# Patient Record
Sex: Female | Born: 1950
Health system: Southern US, Community
[De-identification: ages and names within clinical notes are randomized; demographics above are authoritative.]

## PROBLEM LIST (undated history)

## (undated) DIAGNOSIS — G8929 Other chronic pain: Secondary | ICD-10-CM

## (undated) DIAGNOSIS — M810 Age-related osteoporosis without current pathological fracture: Secondary | ICD-10-CM

## (undated) DIAGNOSIS — D369 Benign neoplasm, unspecified site: Secondary | ICD-10-CM

## (undated) DIAGNOSIS — R42 Dizziness and giddiness: Secondary | ICD-10-CM

## (undated) DIAGNOSIS — K219 Gastro-esophageal reflux disease without esophagitis: Secondary | ICD-10-CM

## (undated) DIAGNOSIS — I10 Essential (primary) hypertension: Secondary | ICD-10-CM

## (undated) DIAGNOSIS — M549 Dorsalgia, unspecified: Secondary | ICD-10-CM

## (undated) HISTORY — DX: Other chronic pain: G89.29

## (undated) HISTORY — DX: Gastro-esophageal reflux disease without esophagitis: K21.9

## (undated) HISTORY — DX: Dizziness and giddiness: R42

## (undated) HISTORY — PX: ABCESS DRAINAGE: SHX399

## (undated) HISTORY — DX: Benign neoplasm, unspecified site: D36.9

## (undated) HISTORY — DX: Age-related osteoporosis without current pathological fracture: M81.0

## (undated) HISTORY — PX: CHOLECYSTECTOMY: SHX55

## (undated) HISTORY — DX: Dorsalgia, unspecified: M54.9

## (undated) HISTORY — PX: TUBAL LIGATION: SHX77

---

## 2001-07-10 ENCOUNTER — Encounter: Payer: Self-pay | Admitting: Unknown Physician Specialty

## 2001-07-10 ENCOUNTER — Ambulatory Visit (HOSPITAL_COMMUNITY): Admission: RE | Admit: 2001-07-10 | Discharge: 2001-07-10 | Payer: Self-pay | Admitting: Unknown Physician Specialty

## 2001-08-09 ENCOUNTER — Encounter (HOSPITAL_COMMUNITY): Admission: RE | Admit: 2001-08-09 | Discharge: 2001-09-08 | Payer: Self-pay | Admitting: Unknown Physician Specialty

## 2002-05-16 ENCOUNTER — Emergency Department (HOSPITAL_COMMUNITY): Admission: EM | Admit: 2002-05-16 | Discharge: 2002-05-16 | Payer: Self-pay | Admitting: Emergency Medicine

## 2002-11-02 ENCOUNTER — Emergency Department (HOSPITAL_COMMUNITY): Admission: EM | Admit: 2002-11-02 | Discharge: 2002-11-02 | Payer: Self-pay | Admitting: Emergency Medicine

## 2002-11-06 ENCOUNTER — Encounter: Payer: Self-pay | Admitting: Family Medicine

## 2002-11-06 ENCOUNTER — Ambulatory Visit (HOSPITAL_COMMUNITY): Admission: RE | Admit: 2002-11-06 | Discharge: 2002-11-06 | Payer: Self-pay | Admitting: Family Medicine

## 2003-08-18 ENCOUNTER — Emergency Department (HOSPITAL_COMMUNITY): Admission: EM | Admit: 2003-08-18 | Discharge: 2003-08-18 | Payer: Self-pay | Admitting: Emergency Medicine

## 2004-02-14 ENCOUNTER — Emergency Department (HOSPITAL_COMMUNITY): Admission: EM | Admit: 2004-02-14 | Discharge: 2004-02-14 | Payer: Self-pay | Admitting: Emergency Medicine

## 2004-03-28 ENCOUNTER — Emergency Department (HOSPITAL_COMMUNITY): Admission: EM | Admit: 2004-03-28 | Discharge: 2004-03-28 | Payer: Self-pay | Admitting: Emergency Medicine

## 2004-04-08 ENCOUNTER — Emergency Department (HOSPITAL_COMMUNITY): Admission: EM | Admit: 2004-04-08 | Discharge: 2004-04-08 | Payer: Self-pay | Admitting: Emergency Medicine

## 2004-07-13 ENCOUNTER — Ambulatory Visit (HOSPITAL_COMMUNITY): Admission: RE | Admit: 2004-07-13 | Discharge: 2004-07-13 | Payer: Self-pay | Admitting: Family Medicine

## 2004-08-12 ENCOUNTER — Emergency Department (HOSPITAL_COMMUNITY): Admission: EM | Admit: 2004-08-12 | Discharge: 2004-08-12 | Payer: Self-pay | Admitting: Emergency Medicine

## 2004-10-23 ENCOUNTER — Emergency Department (HOSPITAL_COMMUNITY): Admission: EM | Admit: 2004-10-23 | Discharge: 2004-10-23 | Payer: Self-pay | Admitting: Emergency Medicine

## 2005-03-01 ENCOUNTER — Emergency Department (HOSPITAL_COMMUNITY): Admission: EM | Admit: 2005-03-01 | Discharge: 2005-03-01 | Payer: Self-pay | Admitting: Emergency Medicine

## 2005-04-09 ENCOUNTER — Emergency Department (HOSPITAL_COMMUNITY): Admission: EM | Admit: 2005-04-09 | Discharge: 2005-04-09 | Payer: Self-pay | Admitting: *Deleted

## 2005-04-10 ENCOUNTER — Emergency Department (HOSPITAL_COMMUNITY): Admission: EM | Admit: 2005-04-10 | Discharge: 2005-04-10 | Payer: Self-pay | Admitting: Emergency Medicine

## 2005-04-22 ENCOUNTER — Emergency Department (HOSPITAL_COMMUNITY): Admission: EM | Admit: 2005-04-22 | Discharge: 2005-04-22 | Payer: Self-pay | Admitting: Emergency Medicine

## 2005-05-28 ENCOUNTER — Emergency Department (HOSPITAL_COMMUNITY): Admission: EM | Admit: 2005-05-28 | Discharge: 2005-05-28 | Payer: Self-pay | Admitting: Emergency Medicine

## 2005-07-20 ENCOUNTER — Ambulatory Visit (HOSPITAL_COMMUNITY): Admission: RE | Admit: 2005-07-20 | Discharge: 2005-07-20 | Payer: Self-pay | Admitting: Family Medicine

## 2005-07-23 ENCOUNTER — Emergency Department (HOSPITAL_COMMUNITY): Admission: EM | Admit: 2005-07-23 | Discharge: 2005-07-23 | Payer: Self-pay | Admitting: Emergency Medicine

## 2006-06-12 ENCOUNTER — Emergency Department (HOSPITAL_COMMUNITY): Admission: EM | Admit: 2006-06-12 | Discharge: 2006-06-12 | Payer: Self-pay | Admitting: Emergency Medicine

## 2006-11-10 ENCOUNTER — Ambulatory Visit (HOSPITAL_COMMUNITY): Admission: RE | Admit: 2006-11-10 | Discharge: 2006-11-10 | Payer: Self-pay | Admitting: Family Medicine

## 2006-11-11 ENCOUNTER — Emergency Department (HOSPITAL_COMMUNITY): Admission: EM | Admit: 2006-11-11 | Discharge: 2006-11-11 | Payer: Self-pay | Admitting: Emergency Medicine

## 2006-12-24 ENCOUNTER — Emergency Department (HOSPITAL_COMMUNITY): Admission: EM | Admit: 2006-12-24 | Discharge: 2006-12-24 | Payer: Self-pay | Admitting: Emergency Medicine

## 2007-01-21 ENCOUNTER — Emergency Department (HOSPITAL_COMMUNITY): Admission: EM | Admit: 2007-01-21 | Discharge: 2007-01-21 | Payer: Self-pay | Admitting: Emergency Medicine

## 2007-02-10 ENCOUNTER — Emergency Department (HOSPITAL_COMMUNITY): Admission: EM | Admit: 2007-02-10 | Discharge: 2007-02-10 | Payer: Self-pay | Admitting: Emergency Medicine

## 2007-02-12 ENCOUNTER — Emergency Department (HOSPITAL_COMMUNITY): Admission: EM | Admit: 2007-02-12 | Discharge: 2007-02-12 | Payer: Self-pay | Admitting: Emergency Medicine

## 2007-02-16 ENCOUNTER — Ambulatory Visit (HOSPITAL_COMMUNITY): Admission: RE | Admit: 2007-02-16 | Discharge: 2007-02-16 | Payer: Self-pay | Admitting: General Surgery

## 2007-03-12 ENCOUNTER — Emergency Department (HOSPITAL_COMMUNITY): Admission: EM | Admit: 2007-03-12 | Discharge: 2007-03-12 | Payer: Self-pay | Admitting: Emergency Medicine

## 2007-07-15 ENCOUNTER — Emergency Department (HOSPITAL_COMMUNITY): Admission: EM | Admit: 2007-07-15 | Discharge: 2007-07-15 | Payer: Self-pay | Admitting: Emergency Medicine

## 2007-09-15 ENCOUNTER — Emergency Department (HOSPITAL_COMMUNITY): Admission: EM | Admit: 2007-09-15 | Discharge: 2007-09-15 | Payer: Self-pay | Admitting: Emergency Medicine

## 2007-10-08 ENCOUNTER — Emergency Department (HOSPITAL_COMMUNITY): Admission: EM | Admit: 2007-10-08 | Discharge: 2007-10-08 | Payer: Self-pay | Admitting: Emergency Medicine

## 2007-10-27 ENCOUNTER — Emergency Department (HOSPITAL_COMMUNITY): Admission: EM | Admit: 2007-10-27 | Discharge: 2007-10-27 | Payer: Self-pay | Admitting: Emergency Medicine

## 2007-11-04 ENCOUNTER — Emergency Department (HOSPITAL_COMMUNITY): Admission: EM | Admit: 2007-11-04 | Discharge: 2007-11-04 | Payer: Self-pay | Admitting: Emergency Medicine

## 2007-11-22 ENCOUNTER — Ambulatory Visit (HOSPITAL_COMMUNITY): Admission: RE | Admit: 2007-11-22 | Discharge: 2007-11-22 | Payer: Self-pay | Admitting: Family Medicine

## 2007-12-02 ENCOUNTER — Emergency Department (HOSPITAL_COMMUNITY): Admission: EM | Admit: 2007-12-02 | Discharge: 2007-12-02 | Payer: Self-pay | Admitting: Emergency Medicine

## 2008-12-13 ENCOUNTER — Emergency Department (HOSPITAL_COMMUNITY): Admission: EM | Admit: 2008-12-13 | Discharge: 2008-12-13 | Payer: Self-pay | Admitting: Emergency Medicine

## 2009-02-01 ENCOUNTER — Emergency Department (HOSPITAL_COMMUNITY): Admission: EM | Admit: 2009-02-01 | Discharge: 2009-02-01 | Payer: Self-pay | Admitting: Emergency Medicine

## 2009-02-20 ENCOUNTER — Emergency Department (HOSPITAL_COMMUNITY): Admission: EM | Admit: 2009-02-20 | Discharge: 2009-02-20 | Payer: Self-pay | Admitting: Emergency Medicine

## 2009-03-11 ENCOUNTER — Ambulatory Visit (HOSPITAL_COMMUNITY): Admission: RE | Admit: 2009-03-11 | Discharge: 2009-03-11 | Payer: Self-pay | Admitting: Family Medicine

## 2009-05-25 ENCOUNTER — Emergency Department (HOSPITAL_COMMUNITY): Admission: EM | Admit: 2009-05-25 | Discharge: 2009-05-26 | Payer: Self-pay | Admitting: Emergency Medicine

## 2009-06-08 ENCOUNTER — Emergency Department (HOSPITAL_COMMUNITY): Admission: EM | Admit: 2009-06-08 | Discharge: 2009-06-08 | Payer: Self-pay | Admitting: Emergency Medicine

## 2009-09-21 ENCOUNTER — Emergency Department (HOSPITAL_COMMUNITY): Admission: EM | Admit: 2009-09-21 | Discharge: 2009-09-21 | Payer: Self-pay | Admitting: Emergency Medicine

## 2010-05-31 ENCOUNTER — Emergency Department (HOSPITAL_COMMUNITY): Admission: EM | Admit: 2010-05-31 | Discharge: 2010-05-31 | Payer: Self-pay | Admitting: Emergency Medicine

## 2010-06-27 ENCOUNTER — Emergency Department (HOSPITAL_COMMUNITY): Admission: EM | Admit: 2010-06-27 | Discharge: 2010-06-27 | Payer: Self-pay | Admitting: Emergency Medicine

## 2010-07-14 ENCOUNTER — Emergency Department (HOSPITAL_COMMUNITY)
Admission: EM | Admit: 2010-07-14 | Discharge: 2010-07-14 | Payer: Self-pay | Source: Home / Self Care | Admitting: Emergency Medicine

## 2010-07-27 ENCOUNTER — Emergency Department (HOSPITAL_COMMUNITY)
Admission: EM | Admit: 2010-07-27 | Discharge: 2010-07-27 | Payer: Self-pay | Source: Home / Self Care | Admitting: Emergency Medicine

## 2010-08-22 ENCOUNTER — Encounter: Payer: Self-pay | Admitting: Family Medicine

## 2010-10-13 LAB — DIFFERENTIAL
Basophils Absolute: 0 10*3/uL (ref 0.0–0.1)
Basophils Relative: 1 % (ref 0–1)
Eosinophils Absolute: 0.1 10*3/uL (ref 0.0–0.7)
Eosinophils Relative: 3 % (ref 0–5)
Monocytes Absolute: 0.5 10*3/uL (ref 0.1–1.0)

## 2010-10-13 LAB — URINE MICROSCOPIC-ADD ON

## 2010-10-13 LAB — COMPREHENSIVE METABOLIC PANEL
AST: 18 U/L (ref 0–37)
Albumin: 4.5 g/dL (ref 3.5–5.2)
Alkaline Phosphatase: 59 U/L (ref 39–117)
CO2: 24 mEq/L (ref 19–32)
Chloride: 104 mEq/L (ref 96–112)
GFR calc Af Amer: >60 mL/min (ref >60)
GFR calc non Af Amer: 54 mL/min — ABNORMAL LOW (ref >60)
Potassium: 3.5 mEq/L (ref 3.5–5.1)
Total Bilirubin: 0.2 mg/dL — ABNORMAL LOW (ref 0.3–1.2)

## 2010-10-13 LAB — URINE CULTURE
Colony Count: NO GROWTH
Culture: NO GROWTH

## 2010-10-13 LAB — URINALYSIS, ROUTINE W REFLEX MICROSCOPIC
Glucose, UA: NEGATIVE mg/dL
Leukocytes, UA: NEGATIVE
Nitrite: NEGATIVE
pH: 5 (ref 5.0–8.0)

## 2010-10-13 LAB — CBC
Hemoglobin: 11.2 g/dL — ABNORMAL LOW (ref 12.0–15.0)
MCV: 93.7 fL (ref 78.0–100.0)
Platelets: 272 10*3/uL (ref 150–400)
RBC: 3.69 MIL/uL — ABNORMAL LOW (ref 3.87–5.11)
WBC: 4.8 10*3/uL (ref 4.0–10.5)

## 2010-10-31 ENCOUNTER — Emergency Department (HOSPITAL_COMMUNITY)
Admission: EM | Admit: 2010-10-31 | Discharge: 2010-10-31 | Disposition: A | Discharge status: Home / Self Care | Payer: Self-pay | Attending: Emergency Medicine | Admitting: Emergency Medicine

## 2010-10-31 DIAGNOSIS — X58XXXA Exposure to other specified factors, initial encounter: Secondary | ICD-10-CM | POA: Insufficient documentation

## 2010-10-31 DIAGNOSIS — M545 Low back pain, unspecified: Secondary | ICD-10-CM | POA: Insufficient documentation

## 2010-10-31 DIAGNOSIS — S335XXA Sprain of ligaments of lumbar spine, initial encounter: Secondary | ICD-10-CM | POA: Insufficient documentation

## 2010-10-31 DIAGNOSIS — Z79899 Other long term (current) drug therapy: Secondary | ICD-10-CM | POA: Insufficient documentation

## 2010-10-31 DIAGNOSIS — I10 Essential (primary) hypertension: Secondary | ICD-10-CM | POA: Insufficient documentation

## 2010-10-31 LAB — URINALYSIS, ROUTINE W REFLEX MICROSCOPIC
Bilirubin Urine: NEGATIVE
Glucose, UA: NEGATIVE mg/dL
Ketones, ur: NEGATIVE mg/dL
Leukocytes, UA: NEGATIVE
Nitrite: NEGATIVE
Protein, ur: NEGATIVE mg/dL
Specific Gravity, Urine: 1.02 (ref 1.005–1.030)
Urobilinogen, UA: 0.2 mg/dL (ref 0.0–1.0)
pH: 6 (ref 5.0–8.0)

## 2010-10-31 LAB — URINE MICROSCOPIC-ADD ON

## 2010-11-03 LAB — COMPREHENSIVE METABOLIC PANEL
AST: 22 U/L (ref 0–37)
Albumin: 4.5 g/dL (ref 3.5–5.2)
Calcium: 9.8 mg/dL (ref 8.4–10.5)
Chloride: 97 mEq/L (ref 96–112)
Creatinine, Ser: 0.95 mg/dL (ref 0.4–1.2)
GFR calc Af Amer: >60 mL/min (ref >60)

## 2010-11-03 LAB — DIFFERENTIAL
Eosinophils Relative: 2 % (ref 0–5)
Lymphocytes Relative: 32 % (ref 12–46)
Lymphs Abs: 1.7 10*3/uL (ref 0.7–4.0)
Monocytes Absolute: 0.6 10*3/uL (ref 0.1–1.0)
Monocytes Relative: 10 % (ref 3–12)

## 2010-11-03 LAB — CBC
MCHC: 33.2 g/dL (ref 30.0–36.0)
MCV: 92.9 fL (ref 78.0–100.0)
Platelets: 261 10*3/uL (ref 150–400)
WBC: 5.4 10*3/uL (ref 4.0–10.5)

## 2010-11-03 LAB — URINE MICROSCOPIC-ADD ON

## 2010-11-03 LAB — URINE CULTURE: Culture: NO GROWTH

## 2010-11-03 LAB — URINALYSIS, ROUTINE W REFLEX MICROSCOPIC
Glucose, UA: NEGATIVE mg/dL
Hgb urine dipstick: NEGATIVE
Specific Gravity, Urine: 1.025 (ref 1.005–1.030)
Urobilinogen, UA: 0.2 mg/dL (ref 0.0–1.0)
pH: 6 (ref 5.0–8.0)

## 2010-11-08 ENCOUNTER — Emergency Department (HOSPITAL_COMMUNITY)
Admission: EM | Admit: 2010-11-08 | Discharge: 2010-11-08 | Disposition: A | Discharge status: Home / Self Care | Payer: Self-pay | Attending: Emergency Medicine | Admitting: Emergency Medicine

## 2010-11-08 DIAGNOSIS — R51 Headache: Secondary | ICD-10-CM | POA: Insufficient documentation

## 2010-11-08 DIAGNOSIS — I10 Essential (primary) hypertension: Secondary | ICD-10-CM | POA: Insufficient documentation

## 2010-12-14 NOTE — H&P (Signed)
NAMEGREDMARIE, DELANGE                 ACCOUNT NO.:  [id number]   MEDICAL RECORD NO.:  [id number]          PATIENT TYPE:  AMB   LOCATION:  DAY                           FACILITY:  [facility]   PHYSICIAN:  Dalia Heading, M.D.  DATE OF BIRTH:  07-14-1951   DATE OF ADMISSION:  02/16/2007  DATE OF DISCHARGE:  LH                              HISTORY & PHYSICAL   CHIEF COMPLAINT:  Abscess, groin.   HISTORY OF PRESENT ILLNESS:  The patient is a 60 year old black female  who was referred from the emergency room for evaluation and treatment of  a boil in the left groin region.  It was lanced in the emergency room  last week.  She was started on Bactrim.  The abscess has recurred and  has worsened.   Past medical history includes hypertension.   PAST SURGICAL HISTORY:  Unremarkable.   CURRENT MEDICATIONS:  A blood pressure pill.   ALLERGIES:  No known drug allergies.   REVIEW OF SYSTEMS:  Noncontributory.   On physical examination, the patient is a well-developed, well-nourished  black female in no acute distress.  Lungs are clear to auscultation with equal breath sounds bilaterally.  Heart examination reveals regular rate and rhythm without S3, S4, or  murmurs.  In the left groin/pubic area there is an abscess with induration and  erythema present.   IMPRESSION:  Recurring abscess, left groin.   PLAN:  The patient is scheduled for an incision and drainage of the  abscess in the groin area on February 16, 2007.  The risks and benefits of  the procedure were fully explained to the patient, who gave informed  consent.      Dalia Heading, M.D.  Electronically Signed    MAJ/MEDQ  D:  02/15/2007  T:  02/16/2007  Job:  [id number]   cc:   [facility]  Fax: [phone]

## 2010-12-14 NOTE — Op Note (Signed)
NAMEKARISHA, MARLIN                 ACCOUNT NO.:  [id number]   MEDICAL RECORD NO.:  [id number]          PATIENT TYPE:  AMB   LOCATION:  DAY                           FACILITY:  [facility]   PHYSICIAN:  Dalia Heading, M.D.  DATE OF BIRTH:  [birth year]   DATE OF PROCEDURE:  02/16/2007  DATE OF DISCHARGE:                               OPERATIVE REPORT   PREOPERATIVE DIAGNOSIS:  Groin abscess.   POSTOPERATIVE DIAGNOSIS:  Groin abscess.   PROCEDURE:  Incision and drainage of left groin abscess.   SURGEON:  Franky Macho, MD   ANESTHESIA:  General.   INDICATIONS:  The patient is a 60 year old black female status post  incision and drainage of a left groin abscess in the emergency room, who  presents with non-resolution of the abscess.  The risks and benefits of  the procedure were fully explained to the patient, who gave informed  consent.   PROCEDURE NOTE:  The patient was placed in the supine position.  After  general anesthesia was administered, the left groin region was prepped  and draped using the usual sterile technique with Betadine.  Surgical  site confirmation was performed.   An indurated area along the left suprapubic groin area was noted.  The  induration was approximately 3-4 cm in an ovoid manner.  Incision was  performed.  A small amount of purulent fluid that was obtained.  Aerobic  cultures were taken and sent to microbiology for further examination.  Any necrotic tissue was debrided.  Any bleeding was controlled using  Bovie electrocautery.  The wound was packed with 1/2-inch Iodoform Nu  Gauze.  A dry sterile dressing was then applied.   All tape and needle counts were correct at the end of the procedure.  The patient was awakened and transferred to PACU in stable condition.   COMPLICATIONS:  None.   SPECIMEN:  None.   BLOOD LOSS:  Minimal.      Dalia Heading, M.D.  Electronically Signed     MAJ/MEDQ  D:  02/16/2007  T:  02/17/2007  Job:  [id number]

## 2011-04-25 LAB — URINALYSIS, ROUTINE W REFLEX MICROSCOPIC
Ketones, ur: NEGATIVE
Nitrite: NEGATIVE
Protein, ur: NEGATIVE
pH: 5

## 2011-05-09 LAB — URINE CULTURE: Colony Count: 8000

## 2011-05-09 LAB — URINALYSIS, ROUTINE W REFLEX MICROSCOPIC
Nitrite: NEGATIVE
Specific Gravity, Urine: 1.025
Urobilinogen, UA: 0.2

## 2011-05-16 LAB — BASIC METABOLIC PANEL
BUN: 17
CO2: 24
Calcium: 9.4
Chloride: 108
Creatinine, Ser: 1.12
GFR calc Af Amer: >60
GFR calc non Af Amer: 51 — ABNORMAL LOW
Glucose, Bld: 84
Potassium: 4.6
Sodium: 138

## 2011-05-16 LAB — CULTURE, ROUTINE-ABSCESS

## 2011-05-16 LAB — CBC
MCHC: 32.9
RBC: 3.37 — ABNORMAL LOW
WBC: 5.2

## 2011-05-17 LAB — CULTURE, ROUTINE-ABSCESS

## 2011-12-26 ENCOUNTER — Emergency Department (HOSPITAL_COMMUNITY)
Admission: EM | Admit: 2011-12-26 | Discharge: 2011-12-26 | Disposition: A | Discharge status: Home / Self Care | Payer: Self-pay | Attending: Emergency Medicine | Admitting: Emergency Medicine

## 2011-12-26 ENCOUNTER — Encounter (HOSPITAL_COMMUNITY): Payer: Self-pay | Admitting: *Deleted

## 2011-12-26 DIAGNOSIS — I10 Essential (primary) hypertension: Secondary | ICD-10-CM | POA: Insufficient documentation

## 2011-12-26 DIAGNOSIS — M5430 Sciatica, unspecified side: Secondary | ICD-10-CM | POA: Insufficient documentation

## 2011-12-26 HISTORY — DX: Essential (primary) hypertension: I10

## 2011-12-26 LAB — URINALYSIS, ROUTINE W REFLEX MICROSCOPIC
Ketones, ur: NEGATIVE mg/dL
Leukocytes, UA: NEGATIVE
Nitrite: NEGATIVE
Urobilinogen, UA: 0.2 mg/dL (ref 0.0–1.0)
pH: 6 (ref 5.0–8.0)

## 2011-12-26 LAB — URINE MICROSCOPIC-ADD ON

## 2011-12-26 MED ORDER — PREDNISONE 20 MG PO TABS
60.0000 mg | ORAL_TABLET | Freq: Once | ORAL | Status: AC
  Administered 2011-12-26: 60 mg via ORAL
  Filled 2011-12-26: qty 3

## 2011-12-26 MED ORDER — PREDNISONE 10 MG PO TABS: 20.0000 mg | ORAL_TABLET | Freq: Every day | ORAL | Status: AC

## 2011-12-26 MED ORDER — HYDROCODONE-ACETAMINOPHEN 5-325 MG PO TABS
1.0000 | ORAL_TABLET | Freq: Once | ORAL | Status: AC
  Administered 2011-12-26: 1 via ORAL
  Filled 2011-12-26: qty 1

## 2011-12-26 MED ORDER — HYDROCODONE-ACETAMINOPHEN 5-325 MG PO TABS: 1.0000 | ORAL_TABLET | ORAL | Status: AC | PRN

## 2011-12-26 NOTE — ED Notes (Signed)
Discharge instructions reviewed with pt, questions answered. Pt verbalized understanding.  

## 2011-12-26 NOTE — Discharge Instructions (Signed)
You have a sciatica. Make position changes slowly. He may apply heat to the lower back for comfort. The most comfortable sitting position is going to be in a recliner. Take all of the prednisone. Use the pain medicine as needed.

## 2011-12-26 NOTE — ED Notes (Signed)
Pt reporting pain in left flank area, radiating to front.  Denies problems with urination, such as frequency or burning.

## 2011-12-26 NOTE — ED Provider Notes (Signed)
History     CSN: [id number]  Arrival date & time 12/26/11  0114   First MD Initiated Contact with Patient 12/26/11 [phone]      Chief Complaint  Patient presents with  . Flank Pain    (Consider location/radiation/quality/duration/timing/severity/associated sxs/prior treatment) HPI Megan Barber is a 61 y.o. female who presents to the Emergency Department complaining of right sided buttock pain with radiation down her leg that began on Wednesday. She is not aware of any injury or unusual movement. Pain has been getting progressively worse. Described as a sharp stabbing pain that begins in the buttock and travels down the back of her leg. Worse with sitting and walking . Hard to find a comfortable position.Denies fever, chills, nausea, vomiting. Denies numbness or tingling. Denies extremity weakness.   Past Medical History  Diagnosis Date  . Hypertension     Past Surgical History  Procedure Date  . Abdominal hysterectomy     History reviewed. No pertinent family history.  History  Substance Use Topics  . Smoking status: Never Smoker   . Smokeless tobacco: Not on file  . Alcohol Use: No    OB History    Grav Para Term Preterm Abortions TAB SAB Ect Mult Living                  Review of Systems  Constitutional: Negative for fever.       10 Systems reviewed and are negative for acute change except as noted in the HPI.  HENT: Negative for congestion.   Eyes: Negative for discharge and redness.  Respiratory: Negative for cough and shortness of breath.   Cardiovascular: Negative for chest pain.  Gastrointestinal: Negative for vomiting and abdominal pain.  Musculoskeletal: Negative for back pain.       Right buttock pain, right leg pain  Skin: Negative for rash.  Neurological: Negative for syncope, numbness and headaches.  Psychiatric/Behavioral:       No behavior change.    Allergies  Darvocet and Toradol  Home Medications   Current Outpatient Rx  Name Route Sig  Dispense Refill  . LISINOPRIL 10 MG PO TABS Oral Take 10 mg by mouth daily.      BP 128/70  Pulse 72  Temp 97.5 F (36.4 C)  Resp 20  Ht 5\' 5"  (1.651 m)  Wt 190 lb (86.183 kg)  BMI 31.62 kg/m2  SpO2 99%  Physical Exam  Nursing note and vitals reviewed. Constitutional:       Awake, alert, nontoxic appearance with baseline speech.  HENT:  Head: Atraumatic.  Eyes: Pupils are equal, round, and reactive to light. Right eye exhibits no discharge. Left eye exhibits no discharge.  Neck: Neck supple.  Cardiovascular: Normal rate and regular rhythm.   No murmur heard. Pulmonary/Chest: Effort normal and breath sounds normal. No respiratory distress. She has no wheezes. She has no rales. She exhibits no tenderness.  Abdominal: Soft. Bowel sounds are normal. She exhibits no mass. There is no tenderness. There is no rebound.  Musculoskeletal:       Thoracic back: She exhibits no tenderness.       Lumbar back: She exhibits no tenderness.       Bilateral lower extremities non tender without new rashes or color change, baseline ROM with intact DP / PT pulses, CR<2 secs all digits bilaterally, sensation baseline light touch bilaterally for pt, DTR's symmetric and intact bilaterally KJ / AJ, motor symmetric bilateral 5 / 5 hip flexion, quadriceps, hamstrings, EHL,  foot dorsiflexion, foot plantarflexion, gait somewhat antalgic but without apparent new ataxia.Not able to bend over to reach for her toes due to pain that shoots from buttock to heel. Positive straight leg raise on the right.  Neurological:       Mental status baseline for patient.  Upper extremity motor strength and sensation intact and symmetric bilaterally.  Skin: No rash noted.  Psychiatric: She has a normal mood and affect.    ED Course  Procedures (including critical care time)  Results for orders placed during the hospital encounter of 12/26/11  URINALYSIS, ROUTINE W REFLEX MICROSCOPIC      Component Value Range   Color,  Urine YELLOW  YELLOW    APPearance CLEAR  CLEAR    Specific Gravity, Urine 1.025  1.005 - 1.030    pH 6.0  5.0 - 8.0    Glucose, UA NEGATIVE  NEGATIVE (mg/dL)   Hgb urine dipstick TRACE (*) NEGATIVE    Bilirubin Urine NEGATIVE  NEGATIVE    Ketones, ur NEGATIVE  NEGATIVE (mg/dL)   Protein, ur TRACE (*) NEGATIVE (mg/dL)   Urobilinogen, UA 0.2  0.0 - 1.0 (mg/dL)   Nitrite NEGATIVE  NEGATIVE    Leukocytes, UA NEGATIVE  NEGATIVE   URINE MICROSCOPIC-ADD ON      Component Value Range   Squamous Epithelial / LPF MANY (*) RARE    WBC, UA 0-2  <3 (WBC/hpf)   RBC / HPF 0-2  <3 (RBC/hpf)   Bacteria, UA FEW (*) RARE    Urine-Other CLUE CELLS PRESENT        MDM  Patient with pain to the right buttocks that radiates to the right lower leg has been present since Wednesday. Physical exam is consistent with a sciatica. Initiated steroid and analgesic therapy.Pt stable in ED with no significant deterioration in condition.The patient appears reasonably screened and/or stabilized for discharge and I doubt any other medical condition or other [facility] requiring further screening, evaluation, or treatment in the ED at this time prior to discharge.  MDM Reviewed: nursing note and vitals Interpretation: labs           Nicoletta Dress. Colon Branch, MD 12/26/11 1610

## 2012-05-29 ENCOUNTER — Encounter (HOSPITAL_COMMUNITY): Payer: Self-pay

## 2012-05-29 ENCOUNTER — Emergency Department (HOSPITAL_COMMUNITY)
Admission: EM | Admit: 2012-05-29 | Discharge: 2012-05-29 | Disposition: A | Discharge status: Home / Self Care | Payer: Self-pay | Attending: Emergency Medicine | Admitting: Emergency Medicine

## 2012-05-29 DIAGNOSIS — T7840XA Allergy, unspecified, initial encounter: Secondary | ICD-10-CM | POA: Insufficient documentation

## 2012-05-29 DIAGNOSIS — I10 Essential (primary) hypertension: Secondary | ICD-10-CM | POA: Insufficient documentation

## 2012-05-29 DIAGNOSIS — R229 Localized swelling, mass and lump, unspecified: Secondary | ICD-10-CM | POA: Insufficient documentation

## 2012-05-29 DIAGNOSIS — M549 Dorsalgia, unspecified: Secondary | ICD-10-CM | POA: Insufficient documentation

## 2012-05-29 LAB — URINALYSIS, ROUTINE W REFLEX MICROSCOPIC
Glucose, UA: NEGATIVE mg/dL
Protein, ur: NEGATIVE mg/dL
Specific Gravity, Urine: 1.025 (ref 1.005–1.030)
pH: 5.5 (ref 5.0–8.0)

## 2012-05-29 LAB — URINE MICROSCOPIC-ADD ON

## 2012-05-29 MED ORDER — LORATADINE 10 MG PO TABS
10.0000 mg | ORAL_TABLET | Freq: Once | ORAL | Status: AC
  Administered 2012-05-29: 10 mg via ORAL
  Filled 2012-05-29: qty 1

## 2012-05-29 MED ORDER — FAMOTIDINE 20 MG PO TABS
40.0000 mg | ORAL_TABLET | Freq: Every day | ORAL | Status: DC
  Administered 2012-05-29: 40 mg via ORAL
  Filled 2012-05-29: qty 2

## 2012-05-29 MED ORDER — PREDNISONE 50 MG PO TABS
60.0000 mg | ORAL_TABLET | Freq: Once | ORAL | Status: AC
  Administered 2012-05-29: 60 mg via ORAL
  Filled 2012-05-29: qty 1

## 2012-05-29 MED ORDER — PREDNISONE 50 MG PO TABS: 50.0000 mg | ORAL_TABLET | Freq: Every day | ORAL | Status: DC

## 2012-05-29 MED ORDER — IBUPROFEN 800 MG PO TABS
800.0000 mg | ORAL_TABLET | Freq: Once | ORAL | Status: AC
  Administered 2012-05-29: 800 mg via ORAL
  Filled 2012-05-29: qty 1

## 2012-05-29 NOTE — ED Notes (Signed)
Pt reports that for the last week she has been waking every morning with "something else hurting" and ?abcesses to different spots. First area was at her neck. Woke this am with right lower back hurting and right eye swollen.

## 2012-05-29 NOTE — ED Provider Notes (Signed)
History    This chart was scribed for Donnetta Hutching, MD, MD by Smitty Pluck. The patient was seen in room APA10 and the patient's care was started at 12:41PM.   CSN: [id number]  Arrival date & time 05/29/12  4098      Chief Complaint  Patient presents with  . Facial Swelling  . Wound Check    to back of neck  . Rash  . Back Pain    (Consider location/radiation/quality/duration/timing/severity/associated sxs/prior treatment) Patient is a 61 y.o. female presenting with wound check, rash, and back pain. The history is provided by the patient. No language interpreter was used.  Wound Check   Rash   Back Pain    Megan Barber is a 61 y.o. female who presents to the Emergency Department complaining of constant, moderate rash onset 1 week ago. Pt reports that she has bumps that occur over different areas of body onset 1 week ago. She denies change in diet, new soaps and new detergent. She reports that she has right lower lateral with radiation to right flank pain onset this morning. Denies dysuria, difficulty urinating, nausea, fever and any other pain.      Past Medical History  Diagnosis Date  . Hypertension     Past Surgical History  Procedure Date  . Abdominal hysterectomy     No family history on file.  History  Substance Use Topics  . Smoking status: Never Smoker   . Smokeless tobacco: Not on file  . Alcohol Use: No    OB History    Grav Para Term Preterm Abortions TAB SAB Ect Mult Living                  Review of Systems  All other systems reviewed and are negative.  10 Systems reviewed and all are negative for acute change except as noted in the HPI.    Allergies  Darvocet and Toradol  Home Medications   Current Outpatient Rx  Name Route Sig Dispense Refill  . DIPHENHYDRAMINE HCL (SLEEP) 25 MG PO TABS Oral Take 50 mg by mouth 3 (three) times daily as needed. Itching.    Marland Kitchen HYDROCORTISONE 1 % EX CREA Topical Apply 1 application topically 2 (two)  times daily.    Marland Kitchen LISINOPRIL 10 MG PO TABS Oral Take 10 mg by mouth daily.      BP 112/93  Pulse 98  Temp 97.5 F (36.4 C) (Oral)  Resp 20  Ht 5\' 5"  (1.651 m)  Wt 185 lb (83.915 kg)  BMI 30.79 kg/m2  SpO2 100%  Physical Exam  Nursing note and vitals reviewed. Constitutional: She is oriented to person, place, and time. She appears well-developed and well-nourished.  HENT:  Head: Normocephalic and atraumatic.  Eyes: Conjunctivae normal and EOM are normal. Pupils are equal, round, and reactive to light.  Neck: Normal range of motion. Neck supple.  Cardiovascular: Normal rate, regular rhythm and normal heart sounds.   Pulmonary/Chest: Effort normal and breath sounds normal.  Abdominal: Soft. Bowel sounds are normal.  Musculoskeletal: Normal range of motion.  Neurological: She is alert and oriented to person, place, and time.  Skin: Skin is warm and dry.       Papular erythematous areas suggestive of wheal    Psychiatric: She has a normal mood and affect.    ED Course  Procedures (including critical care time) DIAGNOSTIC STUDIES: Oxygen Saturation is 100% on room air, normal by my interpretation.    COORDINATION OF CARE:  12:53 PM Discussed ED treatment with pt (PO Pepcid, PO benadryl, PO prednisone)    Labs Reviewed - No data to display No results found.   No diagnosis found.    MDM  Skin rash appears to be allergic. No evidence of cellulitis. Benign abdomen.  Patient is in no acute distress. Discharge home on prednisone for one week, Claritin, Pepcid      I personally performed the services described in this documentation, which was scribed in my presence. The recorded information has been reviewed and considered.    Donnetta Hutching, MD 05/29/12 1425

## 2012-09-10 ENCOUNTER — Encounter (HOSPITAL_COMMUNITY): Payer: Self-pay | Admitting: *Deleted

## 2012-09-10 ENCOUNTER — Emergency Department (HOSPITAL_COMMUNITY)
Admission: EM | Admit: 2012-09-10 | Discharge: 2012-09-10 | Disposition: A | Discharge status: Home / Self Care | Payer: Self-pay | Attending: Emergency Medicine | Admitting: Emergency Medicine

## 2012-09-10 DIAGNOSIS — M169 Osteoarthritis of hip, unspecified: Secondary | ICD-10-CM | POA: Insufficient documentation

## 2012-09-10 DIAGNOSIS — Z79899 Other long term (current) drug therapy: Secondary | ICD-10-CM | POA: Insufficient documentation

## 2012-09-10 DIAGNOSIS — M1990 Unspecified osteoarthritis, unspecified site: Secondary | ICD-10-CM

## 2012-09-10 DIAGNOSIS — M25559 Pain in unspecified hip: Secondary | ICD-10-CM | POA: Insufficient documentation

## 2012-09-10 DIAGNOSIS — M1610 Unilateral primary osteoarthritis, unspecified hip: Secondary | ICD-10-CM | POA: Insufficient documentation

## 2012-09-10 DIAGNOSIS — M25552 Pain in left hip: Secondary | ICD-10-CM

## 2012-09-10 DIAGNOSIS — I10 Essential (primary) hypertension: Secondary | ICD-10-CM | POA: Insufficient documentation

## 2012-09-10 MED ORDER — DEXAMETHASONE 4 MG PO TABS: 4.0000 mg | ORAL_TABLET | Freq: Every day | ORAL | Status: DC

## 2012-09-10 MED ORDER — DEXAMETHASONE SODIUM PHOSPHATE 4 MG/ML IJ SOLN
8.0000 mg | Freq: Once | INTRAMUSCULAR | Status: AC
  Administered 2012-09-10: 8 mg via INTRAMUSCULAR
  Filled 2012-09-10: qty 2

## 2012-09-10 MED ORDER — HYDROCODONE-ACETAMINOPHEN 5-325 MG PO TABS: ORAL_TABLET | ORAL | Status: DC

## 2012-09-10 MED ORDER — PROMETHAZINE HCL 12.5 MG PO TABS
12.5000 mg | ORAL_TABLET | Freq: Once | ORAL | Status: AC
  Administered 2012-09-10: 12.5 mg via ORAL
  Filled 2012-09-10: qty 1

## 2012-09-10 MED ORDER — HYDROCODONE-ACETAMINOPHEN 5-325 MG PO TABS
2.0000 | ORAL_TABLET | Freq: Once | ORAL | Status: AC
  Administered 2012-09-10: 2 via ORAL
  Filled 2012-09-10: qty 2

## 2012-09-10 NOTE — ED Provider Notes (Signed)
Medical screening examination/treatment/procedure(s) were performed by non-physician practitioner and as supervising physician I was immediately available for consultation/collaboration.   Carleene Cooper III, MD 09/10/12 [phone]

## 2012-09-10 NOTE — ED Provider Notes (Signed)
History     CSN: [id number]  Arrival date & time 09/10/12  0944   First MD Initiated Contact with Patient 09/10/12 [phone]      Chief Complaint  Patient presents with  . Hip Pain    (Consider location/radiation/quality/duration/timing/severity/associated sxs/prior treatment) Patient is a 62 y.o. female presenting with hip pain. The history is provided by the patient.  Hip Pain This is a recurrent problem. The current episode started in the past 7 days. The problem occurs daily. The problem has been gradually worsening. Associated symptoms include arthralgias. Pertinent negatives include no abdominal pain, chest pain, coughing or neck pain. The symptoms are aggravated by standing and walking. She has tried NSAIDs for the symptoms. The treatment provided no relief.    Past Medical History  Diagnosis Date  . Hypertension     Past Surgical History  Procedure Laterality Date  . Abdominal hysterectomy      No family history on file.  History  Substance Use Topics  . Smoking status: Never Smoker   . Smokeless tobacco: Not on file  . Alcohol Use: No    OB History   Grav Para Term Preterm Abortions TAB SAB Ect Mult Living                  Review of Systems  Constitutional: Negative for activity change.       All ROS Neg except as noted in HPI  HENT: Negative for nosebleeds and neck pain.   Eyes: Negative for photophobia and discharge.  Respiratory: Negative for cough, shortness of breath and wheezing.   Cardiovascular: Negative for chest pain and palpitations.  Gastrointestinal: Negative for abdominal pain and blood in stool.  Genitourinary: Negative for dysuria, frequency and hematuria.  Musculoskeletal: Positive for back pain and arthralgias.  Skin: Negative.   Neurological: Negative for dizziness, seizures and speech difficulty.  Psychiatric/Behavioral: Negative for hallucinations and confusion.    Allergies  Darvocet and Toradol  Home Medications   Current  Outpatient Rx  Name  Route  Sig  Dispense  Refill  . diphenhydrAMINE (SOMINEX) 25 MG tablet   Oral   Take 50 mg by mouth 3 (three) times daily as needed. Itching.         . hydrocortisone cream 1 %   Topical   Apply 1 application topically 2 (two) times daily.         Marland Kitchen lisinopril (PRINIVIL,ZESTRIL) 10 MG tablet   Oral   Take 10 mg by mouth daily.         . predniSONE (DELTASONE) 50 MG tablet   Oral   Take 1 tablet (50 mg total) by mouth daily.   7 tablet   1     BP 114/64  Pulse 76  Temp(Src) 98 F (36.7 C) (Oral)  Resp 16  Ht 5\' 4"  (1.626 m)  Wt 180 lb (81.647 kg)  BMI 30.88 kg/m2  SpO2 99%  Physical Exam  Nursing note and vitals reviewed. Constitutional: She is oriented to person, place, and time. She appears well-developed and well-nourished.  Non-toxic appearance.  HENT:  Head: Normocephalic.  Right Ear: Tympanic membrane and external ear normal.  Left Ear: Tympanic membrane and external ear normal.  Eyes: EOM and lids are normal. Pupils are equal, round, and reactive to light.  Neck: Normal range of motion. Neck supple. Carotid bruit is not present.  Cardiovascular: Normal rate, regular rhythm, normal heart sounds, intact distal pulses and normal pulses.   Pulmonary/Chest: Breath sounds normal.  No respiratory distress.  Abdominal: Soft. Bowel sounds are normal. There is no tenderness. There is no guarding.  Musculoskeletal: Normal range of motion.  Pain with attempted ROM of the left hip. No hot areas. No dislocation or deformity. Pain with standing. DJD changes of the upper extremities.  Lymphadenopathy:       Head (right side): No submandibular adenopathy present.       Head (left side): No submandibular adenopathy present.    She has no cervical adenopathy.  Neurological: She is alert and oriented to person, place, and time. She has normal strength. No cranial nerve deficit or sensory deficit.  Skin: Skin is warm and dry.  Psychiatric: She has a  normal mood and affect. Her speech is normal.    ED Course  Procedures (including critical care time)  Labs Reviewed - No data to display No results found.   No diagnosis found.    MDM  I have reviewed nursing notes, vital signs, and all appropriate lab and imaging results for this patient. Hx and exam are c/w djd involving the hip. Hx of low back pain. Plan decadron and norco. zofran for nausea. Pt to see orthopedics for evaluation.       Kathie Dike, [place] 09/10/12 1055

## 2012-09-10 NOTE — ED Notes (Signed)
Left hip pain x 4 days.  Denies injury.  Ambulatory to room with limp.

## 2012-09-16 ENCOUNTER — Encounter (HOSPITAL_COMMUNITY): Payer: Self-pay | Admitting: *Deleted

## 2012-09-16 ENCOUNTER — Emergency Department (HOSPITAL_COMMUNITY): Payer: Self-pay

## 2012-09-16 ENCOUNTER — Emergency Department (HOSPITAL_COMMUNITY)
Admission: EM | Admit: 2012-09-16 | Discharge: 2012-09-16 | Disposition: A | Discharge status: Home / Self Care | Payer: Self-pay | Attending: Emergency Medicine | Admitting: Emergency Medicine

## 2012-09-16 DIAGNOSIS — I10 Essential (primary) hypertension: Secondary | ICD-10-CM | POA: Insufficient documentation

## 2012-09-16 DIAGNOSIS — Z79899 Other long term (current) drug therapy: Secondary | ICD-10-CM | POA: Insufficient documentation

## 2012-09-16 DIAGNOSIS — G8929 Other chronic pain: Secondary | ICD-10-CM

## 2012-09-16 DIAGNOSIS — M25559 Pain in unspecified hip: Secondary | ICD-10-CM | POA: Insufficient documentation

## 2012-09-16 MED ORDER — CYCLOBENZAPRINE HCL 10 MG PO TABS: 10.0000 mg | ORAL_TABLET | Freq: Two times a day (BID) | ORAL | Status: DC | PRN

## 2012-09-16 NOTE — ED Provider Notes (Signed)
History     CSN: [id number]  Arrival date & time 09/16/12  1136   None     Chief Complaint  Patient presents with  . Leg Pain    HPI Megan Barber is a 62 y.o. female who presents to the ED with left leg pain. The pain starts in the hip with radiation to the area just above the knee. This is a recurrent problem. The pain has been persistent since her last ED visit 09/10/12. She was to follow up with ortho but states the office was closed due to snow. She is taking hydrocodone and ibuprofen without relief of the pain.  Past Medical History  Diagnosis Date  . Hypertension     Past Surgical History  Procedure Laterality Date  . Abdominal hysterectomy      No family history on file.  History  Substance Use Topics  . Smoking status: Never Smoker   . Smokeless tobacco: Not on file  . Alcohol Use: No    OB History   Grav Para Term Preterm Abortions TAB SAB Ect Mult Living                  Review of Systems  Allergies  Darvocet and Toradol  Home Medications   Current Outpatient Rx  Name  Route  Sig  Dispense  Refill  . dexamethasone (DECADRON) 4 MG tablet   Oral   Take 1 tablet (4 mg total) by mouth daily with breakfast.   6 tablet   0   . HYDROcodone-acetaminophen (NORCO/VICODIN) 5-325 MG per tablet      1 or 2 po q4h prn pain   20 tablet   0   . ibuprofen (ADVIL,MOTRIN) 200 MG tablet   Oral   Take 1,000 mg by mouth every 6 (six) hours as needed for pain.         Marland Kitchen lisinopril (PRINIVIL,ZESTRIL) 10 MG tablet   Oral   Take 10 mg by mouth daily.           BP 108/71  Pulse 64  Temp(Src) 98.2 F (36.8 C) (Oral)  Resp 16  Ht 5\' 3"  (1.6 m)  Wt 180 lb (81.647 kg)  BMI 31.89 kg/m2  SpO2 100%  Physical Exam  Nursing note and vitals reviewed. Constitutional: She is oriented to person, place, and time. No distress.  obese  HENT:  Head: Normocephalic and atraumatic.  Eyes: EOM are normal. Pupils are equal, round, and reactive to light.  Neck:  Neck supple.  Cardiovascular: Normal rate and regular rhythm.   Pulmonary/Chest: Effort normal and breath sounds normal.  Musculoskeletal:  Tender over lateral left hip and left upper leg with palpation. Pain with ambulation. Pedal pulses strong and equal bilateral. Adequate circulation. Good strength and touch sensation.  Neurological: She is alert and oriented to person, place, and time. She has normal strength and normal reflexes. No cranial nerve deficit.  Skin: Skin is warm and dry.  Psychiatric: She has a normal mood and affect. Her behavior is normal. Judgment normal.   Procedures Dg Hip Complete Left  09/16/2012  *RADIOLOGY REPORT*  Clinical Data: Leg pain  LEFT HIP - COMPLETE 2+ VIEW  Comparison: 07/14/2010  Findings: No acute fracture and no dislocation.  Mild degenerative changes of the hip joints.  Spurring of the left greater trochanter.  Degenerative changes in the lower lumbar spine are present.  IMPRESSION: No acute bony pathology.   Original Report Authenticated By: Jolaine Click, M.D.  Assessment: 62 y.o. female with left hip and leg pain   DJD    Plan:  Continue medication for pain and inflammation   Follow up with ortho as planned Discussed with the patient and all questioned fully answered.    Medication List    TAKE these medications       cyclobenzaprine 10 MG tablet  Commonly known as:  FLEXERIL  Take 1 tablet (10 mg total) by mouth 2 (two) times daily as needed for muscle spasms.      ASK your doctor about these medications       dexamethasone 4 MG tablet  Commonly known as:  DECADRON  Take 1 tablet (4 mg total) by mouth daily with breakfast.     HYDROcodone-acetaminophen 5-325 MG per tablet  Commonly known as:  NORCO/VICODIN  Take 1 tablet by mouth every 4 (four) hours as needed for pain. 1 or 2 po q4h prn pain     ibuprofen 200 MG tablet  Commonly known as:  ADVIL,MOTRIN  Take 1,000 mg by mouth every 6 (six) hours as needed for pain.      lisinopril 10 MG tablet  Commonly known as:  PRINIVIL,ZESTRIL  Take 10 mg by mouth daily.           [facility] Orlene Och, NP 09/16/12 1434

## 2012-09-16 NOTE — ED Provider Notes (Signed)
Medical screening examination/treatment/procedure(s) were performed by non-physician practitioner and as supervising physician I was immediately available for consultation/collaboration. Zyir Gassert, MD, FACEP   Demario Faniel L Damieon Armendariz, MD 09/16/12 1624 

## 2012-09-16 NOTE — ED Notes (Signed)
Patient transported to X-ray 

## 2012-09-16 NOTE — ED Notes (Signed)
Left leg pain from hip and radiates just above the knee. Ongoing pain since last ED visit.

## 2012-09-16 NOTE — ED Notes (Signed)
Pt with continued pain to left hip, unable to make an appt with Hilda Lias who pt was referred due to inclement weather last week, has one or two doses left of pain meds, pt not able to get comfortable due to pain

## 2012-11-26 ENCOUNTER — Encounter (HOSPITAL_COMMUNITY): Payer: Self-pay | Admitting: *Deleted

## 2012-11-26 ENCOUNTER — Emergency Department (HOSPITAL_COMMUNITY)
Admission: EM | Admit: 2012-11-26 | Discharge: 2012-11-26 | Disposition: A | Discharge status: Home / Self Care | Payer: Self-pay | Attending: Emergency Medicine | Admitting: Emergency Medicine

## 2012-11-26 DIAGNOSIS — I10 Essential (primary) hypertension: Secondary | ICD-10-CM | POA: Insufficient documentation

## 2012-11-26 DIAGNOSIS — IMO0002 Reserved for concepts with insufficient information to code with codable children: Secondary | ICD-10-CM | POA: Insufficient documentation

## 2012-11-26 DIAGNOSIS — Z79899 Other long term (current) drug therapy: Secondary | ICD-10-CM | POA: Insufficient documentation

## 2012-11-26 DIAGNOSIS — M545 Low back pain: Secondary | ICD-10-CM

## 2012-11-26 DIAGNOSIS — R51 Headache: Secondary | ICD-10-CM | POA: Insufficient documentation

## 2012-11-26 MED ORDER — HYDROCODONE-ACETAMINOPHEN 5-325 MG PO TABS: 1.0000 | ORAL_TABLET | ORAL | Status: DC | PRN

## 2012-11-26 MED ORDER — DEXAMETHASONE 6 MG PO TABS: ORAL_TABLET | ORAL | Status: DC

## 2012-11-26 NOTE — ED Notes (Signed)
C/O lumbar spine pain, radiating down R leg to knee. HA x 3 days.  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  Patient is to see Dr. Hilda Lias when her medicaid card  Comes in.

## 2012-11-26 NOTE — ED Notes (Signed)
Patient with no complaints at this time. Respirations even and unlabored. Skin warm/dry. Discharge instructions reviewed with patient at this time. Patient given opportunity to voice concerns/ask questions. Patient discharged at this time and left Emergency Department with steady gait.   

## 2012-11-26 NOTE — ED Notes (Signed)
Low back pain, headache for 2 days No known injury.  No relief with advil No N/V

## 2012-11-26 NOTE — ED Provider Notes (Signed)
History     CSN: [id number]  Arrival date & time 11/26/12  1047   First MD Initiated Contact with Patient 11/26/12 1108      Chief Complaint  Patient presents with  . Back Pain    (Consider location/radiation/quality/duration/timing/severity/associated sxs/prior treatment) Patient is a 62 y.o. female presenting with back pain. The history is provided by the patient.  Back Pain Location:  Lumbar spine Quality:  Aching Pain severity:  Moderate Pain is:  Same all the time Onset quality:  Gradual Duration:  2 days Timing:  Intermittent Progression:  Worsening Chronicity:  Chronic Context: not falling and not recent injury   Relieved by:  Nothing Worsened by:  Movement Ineffective treatments:  None tried Associated symptoms: headaches   Associated symptoms: no abdominal pain, no bladder incontinence, no bowel incontinence, no chest pain, no dysuria and no perianal numbness     Past Medical History  Diagnosis Date  . Hypertension     Past Surgical History  Procedure Laterality Date  . Abdominal hysterectomy      History reviewed. No pertinent family history.  History  Substance Use Topics  . Smoking status: Never Smoker   . Smokeless tobacco: Not on file  . Alcohol Use: No    OB History   Grav Para Term Preterm Abortions TAB SAB Ect Mult Living                  Review of Systems  Constitutional: Negative for activity change.       All ROS Neg except as noted in HPI  HENT: Negative for nosebleeds and neck pain.   Eyes: Negative for photophobia and discharge.  Respiratory: Negative for cough, shortness of breath and wheezing.   Cardiovascular: Negative for chest pain and palpitations.  Gastrointestinal: Negative for abdominal pain, blood in stool and bowel incontinence.  Genitourinary: Negative for bladder incontinence, dysuria, frequency and hematuria.  Musculoskeletal: Positive for back pain. Negative for arthralgias.  Skin: Negative.   Neurological:  Positive for headaches. Negative for dizziness, seizures and speech difficulty.  Psychiatric/Behavioral: Negative for hallucinations and confusion.    Allergies  Darvocet and Toradol  Home Medications   Current Outpatient Rx  Name  Route  Sig  Dispense  Refill  . lisinopril (PRINIVIL,ZESTRIL) 10 MG tablet   Oral   Take 10 mg by mouth daily.           BP 110/64  Pulse 76  Temp(Src) 97.3 F (36.3 C) (Oral)  Resp 18  Ht 5\' 5"  (1.651 m)  Wt 180 lb (81.647 kg)  BMI 29.95 kg/m2  SpO2 100%  Physical Exam  Nursing note and vitals reviewed. Constitutional: She is oriented to person, place, and time. She appears well-developed and well-nourished.  Non-toxic appearance.  HENT:  Head: Normocephalic.  Right Ear: Tympanic membrane and external ear normal.  Left Ear: Tympanic membrane and external ear normal.  Eyes: EOM and lids are normal. Pupils are equal, round, and reactive to light.  Neck: Normal range of motion. Neck supple. Carotid bruit is not present.  Cardiovascular: Normal rate, regular rhythm, normal heart sounds, intact distal pulses and normal pulses.   Pulmonary/Chest: Breath sounds normal. No respiratory distress.  Abdominal: Soft. Bowel sounds are normal. There is no tenderness. There is no guarding.  Musculoskeletal: Normal range of motion.  Lower lumbar pain to palpation and change of position.   Lymphadenopathy:       Head (right side): No submandibular adenopathy present.  Head (left side): No submandibular adenopathy present.    She has no cervical adenopathy.  Neurological: She is alert and oriented to person, place, and time. She has normal strength. No cranial nerve deficit or sensory deficit. She exhibits normal muscle tone. Coordination normal.  Skin: Skin is warm and dry.  Psychiatric: She has a normal mood and affect. Her speech is normal.    ED Course  Procedures (including critical care time)  Labs Reviewed - No data to display No results  found.   No diagnosis found.    MDM  I have reviewed nursing notes, vital signs, and all appropriate lab and imaging results for this patient. Pt has hx of back pain and headache. She has been treated for her back in the past. No loss of bowel or bladder function.Pt is ambulatory with minimal problem.  Plan Rx for decadron and norco. Pt to follow up with her primary MD.       Kathie Dike, PA-C 11/27/12 [phone]

## 2012-11-27 NOTE — ED Provider Notes (Signed)
Medical screening examination/treatment/procedure(s) were performed by non-physician practitioner and as supervising physician I was immediately available for consultation/collaboration.   Oluwatomiwa Kinyon, MD 11/27/12 1459 

## 2013-01-30 ENCOUNTER — Emergency Department (HOSPITAL_COMMUNITY)
Admission: EM | Admit: 2013-01-30 | Discharge: 2013-01-30 | Disposition: A | Discharge status: Home / Self Care | Payer: Self-pay | Attending: Emergency Medicine | Admitting: Emergency Medicine

## 2013-01-30 ENCOUNTER — Encounter (HOSPITAL_COMMUNITY): Payer: Self-pay | Admitting: *Deleted

## 2013-01-30 DIAGNOSIS — Z79899 Other long term (current) drug therapy: Secondary | ICD-10-CM | POA: Insufficient documentation

## 2013-01-30 DIAGNOSIS — M545 Low back pain, unspecified: Secondary | ICD-10-CM | POA: Insufficient documentation

## 2013-01-30 DIAGNOSIS — I10 Essential (primary) hypertension: Secondary | ICD-10-CM | POA: Insufficient documentation

## 2013-01-30 DIAGNOSIS — M62830 Muscle spasm of back: Secondary | ICD-10-CM

## 2013-01-30 DIAGNOSIS — M62838 Other muscle spasm: Secondary | ICD-10-CM | POA: Insufficient documentation

## 2013-01-30 MED ORDER — PREDNISONE 20 MG PO TABS: ORAL_TABLET | ORAL | Status: DC

## 2013-01-30 MED ORDER — METHOCARBAMOL 500 MG PO TABS: 1000.0000 mg | ORAL_TABLET | Freq: Four times a day (QID) | ORAL | Status: DC | PRN

## 2013-01-30 MED ORDER — TRAMADOL HCL 50 MG PO TABS: 100.0000 mg | ORAL_TABLET | Freq: Four times a day (QID) | ORAL | Status: DC | PRN

## 2013-01-30 MED ORDER — PREDNISONE 50 MG PO TABS
60.0000 mg | ORAL_TABLET | Freq: Once | ORAL | Status: AC
  Administered 2013-01-30: 60 mg via ORAL
  Filled 2013-01-30: qty 1

## 2013-01-30 MED ORDER — METHOCARBAMOL 500 MG PO TABS
1000.0000 mg | ORAL_TABLET | Freq: Three times a day (TID) | ORAL | Status: DC
  Administered 2013-01-30: 1000 mg via ORAL
  Filled 2013-01-30: qty 1

## 2013-01-30 NOTE — ED Notes (Signed)
Lower back pain since yesterday. Hx of same. Pt denies injury. Denies urinary symptoms.

## 2013-01-30 NOTE — ED Provider Notes (Signed)
History  This chart was scribed for Ward Givens, MD by Bennett Scrape, ED Scribe. This patient was seen in room APA12/APA12 and the patient's care was started at 8:53 AM.  CSN: [id number]  Arrival date & time 01/30/13  0826   First MD Initiated Contact with Patient 01/30/13 [phone]     Chief Complaint  Patient presents with  . Back Pain    Patient is a 62 y.o. female presenting with back pain. The history is provided by the patient. No language interpreter was used.  Back Pain Location:  Lumbar spine Quality: "catch" Radiates to:  Does not radiate Pain severity:  Moderate Onset quality:  Gradual Timing:  Constant Progression:  Unchanged Chronicity:  Chronic Context: not falling and not recent injury   Relieved by:  Lying down and being still Worsened by:  Movement Ineffective treatments:  Ibuprofen and OTC medications Associated symptoms: no numbness and no weakness     HPI Comments: Johneisha Broaden Matheny is a 62 y.o. female who presents to the Emergency Department complaining of left lower back pain described as "catch" since yesterday. She denies radiation and reports that the pain is aggravated by movement and resolved with rest or sitting still. She has a h/o the same for the past year and denies any recent injuries or suspect activities. She reports taking tylenol, ibuprofen and Aleve with no improvement in the pain. She denies seeing a specialist for her back stating that she is waiting to be approved for insurance. She denies weakness or numbness as associated symptoms. She has a h/o HTN and denies smoking and alcohol use.  PCP [facility] Department   Past Medical History  Diagnosis Date  . Hypertension    History reviewed. No pertinent past surgical history. No family history on file. History  Substance Use Topics  . Smoking status: Never Smoker   . Smokeless tobacco: Not on file  . 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   Review of Systems  Musculoskeletal: Positive for back pain.  Neurological: Negative for weakness and numbness.    Allergies  Darvocet and Toradol  Home Medications   Current Outpatient Rx  Name  Route  Sig  Dispense  Refill  . lisinopril (PRINIVIL,ZESTRIL) 10 MG tablet   Oral   Take 10 mg by mouth daily.          Triage Vitals: BP 149/72  Pulse 62  Temp(Src) 98.7 F (37.1 C) (Oral)  Resp 16  Ht 5\' 4"  (1.626 m)  Wt 180 lb (81.647 kg)  BMI 30.88 kg/m2  SpO2 100%  Vital signs normal    Physical Exam  Nursing note and vitals reviewed. Constitutional: She is oriented to person, place, and time. She appears well-developed and well-nourished.  Appears uncomfortable changing positions  HENT:  Head: Normocephalic and atraumatic.  Eyes: Conjunctivae and EOM are normal. Pupils are equal, round, and reactive to light.  Neck: Normal range of motion. Neck supple.  Cardiovascular: Normal rate, regular rhythm and normal heart sounds.   Pulmonary/Chest: Effort normal and breath sounds normal.  Musculoskeletal:       Lumbar back: She exhibits decreased range of motion, tenderness, bony tenderness, pain and spasm. She exhibits no swelling, no edema and no deformity.       Back:  Patellar reflexes are 2+ and = bilaterally SLR is negative bilaterally Non-tender thoracic spine Tender in the left para spinous muscles of the lumbar  spine Painful with bending of waist to right and bending forward Area of pain noted  Neurological: She is alert and oriented to person, place, and time.  Reflex Scores:      Patellar reflexes are 2+ on the right side and 2+ on the left side. Skin: Skin is warm, dry and intact. She is not diaphoretic. No erythema. No pallor.    ED Course  Procedures (including critical care time)  Medications  methocarbamol (ROBAXIN) tablet 1,000 mg (1,000 mg Oral Given 01/30/13 0931)  predniSONE (DELTASONE) tablet 60 mg (60 mg Oral Given 01/30/13 0930)     DIAGNOSTIC STUDIES: Oxygen Saturation is 100% on room air, normal by my interpretation.    COORDINATION OF CARE: 9:08 AM-Informed pt of prior visits and advised pt that she will not receive narcotics. Discussed discharge plan which includes Tramadol and Robaxin with pt and pt agreed to plan. Also advised pt to follow up as needed and pt agreed.   This is this patient's fourth ED visit since February for the same complaint. She had x-rays done of her left hip at that time that were normal. She also had x-rays of her lumbar spine in her left hip done in December 2011. Her LS spine showed mild degenerative changes.  Pt states she can take ibuprofen and advil OTC  1. Muscle spasm of back      New Prescriptions   METHOCARBAMOL (ROBAXIN) 500 MG TABLET    Take 2 tablets (1,000 mg total) by mouth 4 (four) times daily as needed (muscle spasm in back).   PREDNISONE (DELTASONE) 20 MG TABLET    Take 3 po QD x 3d , then 2 po QD x 3d then 1 po QD x 3d   TRAMADOL (ULTRAM) 50 MG TABLET    Take 2 tablets (100 mg total) by mouth every 6 (six) hours as needed.    Plan discharge  Devoria Albe, MD, FACEP   MDM    I personally performed the services described in this documentation, which was scribed in my presence. The recorded information has been reviewed and considered.  Devoria Albe, MD, FACEP    Ward Givens, MD 01/30/13 [phone]

## 2013-01-30 NOTE — Discharge Instructions (Signed)
Try heat/ice packs on the sore muscle in your back. Take the medications as prescribed. You need to discuss your chronic back pain with your doctors. Once your pain is better this time do the back exercises to strengthen your back muscles to prevent further problems.    Back Exercises Back exercises help treat and prevent back injuries. The goal is to increase your strength in your belly (abdominal) and back muscles. These exercises can also help with flexibility. Start these exercises when told by your doctor. HOME CARE Back exercises include: Pelvic Tilt.  Lie on your back with your knees bent. Tilt your pelvis until the lower part of your back is against the floor. Hold this position 5 to 10 sec. Repeat this exercise 5 to 10 times. Knee to Chest.  Pull 1 knee up against your chest and hold for 20 to 30 seconds. Repeat this with the other knee. This may be done with the other leg straight or bent, whichever feels better. Then, pull both knees up against your chest. Sit-Ups or Curl-Ups.  Bend your knees 90 degrees. Start with tilting your pelvis, and do a partial, slow sit-up. Only lift your upper half 30 to 45 degrees off the floor. Take at least 2 to 3 seonds for each sit-up. Do not do sit-ups with your knees out straight. If partial sit-ups are difficult, simply do the above but with only tightening your belly (abdominal) muscles and holding it as told. Hip-Lift.  Lie on your back with your knees flexed 90 degrees. Push down with your feet and shoulders as you raise your hips 2 inches off the floor. Hold for 10 seconds, repeat 5 to 10 times. Back Arches.  Lie on your stomach. Prop yourself up on bent elbows. Slowly press on your hands, causing an arch in your low back. Repeat 3 to 5 times. Shoulder-Lifts.  Lie face down with arms beside your body. Keep hips and belly pressed to floor as you slowly lift your head and shoulders off the floor. Do not overdo your exercises. Be careful in the  beginning. Exercises may cause you some mild back discomfort. If the pain lasts for more than 15 minutes, stop the exercises until you see your doctor. Improvement with exercise for back problems is slow.  Document Released: 08/20/2010 Document Revised: 10/10/2011 Document Reviewed: 05/19/2011 [facility] Patient Information 2014 [place], [place].

## 2013-02-11 ENCOUNTER — Emergency Department (HOSPITAL_COMMUNITY)
Admission: EM | Admit: 2013-02-11 | Discharge: 2013-02-12 | Disposition: A | Discharge status: Home / Self Care | Payer: Self-pay | Attending: Emergency Medicine | Admitting: Emergency Medicine

## 2013-02-11 ENCOUNTER — Encounter (HOSPITAL_COMMUNITY): Payer: Self-pay | Admitting: *Deleted

## 2013-02-11 ENCOUNTER — Emergency Department (HOSPITAL_COMMUNITY): Payer: Self-pay

## 2013-02-11 DIAGNOSIS — R109 Unspecified abdominal pain: Secondary | ICD-10-CM

## 2013-02-11 DIAGNOSIS — Z79899 Other long term (current) drug therapy: Secondary | ICD-10-CM | POA: Insufficient documentation

## 2013-02-11 DIAGNOSIS — M545 Low back pain, unspecified: Secondary | ICD-10-CM | POA: Insufficient documentation

## 2013-02-11 DIAGNOSIS — R1032 Left lower quadrant pain: Secondary | ICD-10-CM | POA: Insufficient documentation

## 2013-02-11 DIAGNOSIS — Z87442 Personal history of urinary calculi: Secondary | ICD-10-CM | POA: Insufficient documentation

## 2013-02-11 DIAGNOSIS — I10 Essential (primary) hypertension: Secondary | ICD-10-CM | POA: Insufficient documentation

## 2013-02-11 DIAGNOSIS — M549 Dorsalgia, unspecified: Secondary | ICD-10-CM

## 2013-02-11 DIAGNOSIS — Z9089 Acquired absence of other organs: Secondary | ICD-10-CM | POA: Insufficient documentation

## 2013-02-11 LAB — URINALYSIS, ROUTINE W REFLEX MICROSCOPIC
Glucose, UA: NEGATIVE mg/dL
Leukocytes, UA: NEGATIVE
Protein, ur: NEGATIVE mg/dL
Specific Gravity, Urine: 1.025 (ref 1.005–1.030)
pH: 6.5 (ref 5.0–8.0)

## 2013-02-11 MED ORDER — OXYCODONE-ACETAMINOPHEN 5-325 MG PO TABS
2.0000 | ORAL_TABLET | Freq: Once | ORAL | Status: AC
  Administered 2013-02-11: 2 via ORAL
  Filled 2013-02-11: qty 2

## 2013-02-11 NOTE — ED Notes (Signed)
Pain lt side of back and around to LLQ,  No NVD. No fever.  No urinary sx. Seen here 7/2 with similar sx

## 2013-02-11 NOTE — ED Provider Notes (Signed)
History    This chart was scribed for Lyanne Co, MD, by Frederik Pear, ED scribe. The patient was seen in room APA01/APA01 and the patient's care was started at 2306.   CSN: [id number] Arrival date & time 02/11/13  2046  First MD Initiated Contact with Patient 02/11/13 2306     Chief Complaint  Patient presents with  . Abdominal Pain    The history is provided by the patient and medical records. No language interpreter was used.    HPI Comments: Megan Barber is a 62 y.o. female with a h/o of nephrolithiasis who presents to the Emergency Department complaining of sudden onset, constant, worsening, severe lower back pain that radiates to her abdomen that began more than 2 weeks ago. She was seen in the ED on 7/2 for similar symptoms. She reports a h/o of lower back pain, but reports the current episode is more severe and radiates unlike previous onset on symptoms. She also complains of chills. Denies fever, urinary symptoms, and hematochezia   Past Medical History  Diagnosis Date  . Hypertension    Past Surgical History  Procedure Laterality Date  . Cholecystectomy     History reviewed. No pertinent family history. History  Substance Use Topics  . Smoking status: Never Smoker   . Smokeless tobacco: Not on file  . Alcohol Use: No   OB History   Grav Para Term Preterm Abortions TAB SAB Ect Mult Living                 Review of Systems A complete 10 system review of systems was obtained and all systems are negative except as noted in the HPI and PMH.  Allergies  Darvocet and Toradol  Home Medications   Current Outpatient Rx  Name  Route  Sig  Dispense  Refill  . Aspirin-Salicylamide-Caffeine (BC HEADACHE POWDER PO)   Oral   Take 1 each by mouth daily as needed (pain).         Marland Kitchen lisinopril (PRINIVIL,ZESTRIL) 10 MG tablet   Oral   Take 10 mg by mouth daily.         . methocarbamol (ROBAXIN) 500 MG tablet   Oral   Take 2 tablets (1,000 mg total) by mouth  4 (four) times daily as needed (muscle spasm in back).   60 tablet   0    BP 141/73  Pulse 72  Temp(Src) 98.5 F (36.9 C) (Oral)  Resp 16  Ht 5\' 4"  (1.626 m)  Wt 188 lb 4.8 oz (85.412 kg)  BMI 32.31 kg/m2  SpO2 100% Physical Exam  Nursing note and vitals reviewed. Constitutional: She is oriented to person, place, and time. She appears well-developed and well-nourished. No distress.  HENT:  Head: Normocephalic and atraumatic.  Eyes: EOM are normal.  Neck: Normal range of motion.  Cardiovascular: Normal rate, regular rhythm and normal heart sounds.   Pulmonary/Chest: Effort normal and breath sounds normal.  Abdominal: Soft. She exhibits no distension. There is tenderness.  Mild tenderness to LLQ.  Mild left CVA tenderness.  Musculoskeletal: Normal range of motion.  Neurological: She is alert and oriented to person, place, and time.  Skin: Skin is warm and dry. No rash noted.  No rash.  Psychiatric: She has a normal mood and affect. Judgment normal.    ED Course  Procedures (including critical care time)  DIAGNOSTIC STUDIES: Oxygen Saturation is 100% on room air, normal by my interpretation.    COORDINATION OF CARE:  Discussed planned course of treatment with the patient, who is agreeable at this time.  Labs Reviewed  CBC WITH DIFFERENTIAL - Abnormal; Notable for the following:    RBC 3.83 (*)    Hemoglobin 11.3 (*)    HCT 35.0 (*)    All other components within normal limits  BASIC METABOLIC PANEL - Abnormal; Notable for the following:    Potassium 2.9 (*)    GFR calc non Af Amer 60 (*)    GFR calc Af Amer 70 (*)    All other components within normal limits  URINALYSIS, ROUTINE W REFLEX MICROSCOPIC   Ct Abdomen Pelvis Wo Contrast  02/12/2013   *RADIOLOGY REPORT*  Clinical Data: Left flank pain.  CT ABDOMEN AND PELVIS WITHOUT CONTRAST  Technique:  Multidetector CT imaging of the abdomen and pelvis was performed following the standard protocol without intravenous  contrast.  Comparison: 05/31/2010  Findings: Stable 3 mm nodule in the right middle lobe on image 4. Otherwise, the lung bases are clear.  No evidence for free air.  Gallbladder has been removed.  Normal appearance of the liver, gallbladder, pancreas and adrenal glands.  No evidence for kidney stones or hydronephrosis.  Normal appearance of the urinary bladder.  Uterus has been removed.  There is no significant free fluid or lymphadenopathy.  Normal appearance of the appendix. Stable lucent area within the L2 vertebral body probably represents a vertebral body hemangioma.  No acute bone abnormality.  IMPRESSION: No acute abnormalities within the abdomen or pelvis.  No evidence for kidney stones or hydronephrosis.  Stable 3 mm nodule in the right middle lobe.   Original Report Authenticated By: Richarda Overlie, M.D.   I personally reviewed the imaging tests through PACS system I reviewed available ER/hospitalization records through the EMR   1. Abdominal pain   2. Back pain     MDM  2:02 AM Pt feels better at this time.  Discharge home in good condition.  CT scan labs and urine without significant abnormal  I personally performed the services described in this documentation, which was scribed in my presence. The recorded information has been reviewed and is accurate.      Lyanne Co, MD 02/12/13 [phone]

## 2013-02-12 LAB — BASIC METABOLIC PANEL
CO2: 32 mEq/L (ref 19–32)
Chloride: 102 mEq/L (ref 96–112)
Sodium: 141 mEq/L (ref 135–145)

## 2013-02-12 LAB — CBC WITH DIFFERENTIAL/PLATELET
Basophils Absolute: 0 10*3/uL (ref 0.0–0.1)
HCT: 35 % — ABNORMAL LOW (ref 36.0–46.0)
Lymphocytes Relative: 38 % (ref 12–46)
Neutro Abs: 3.3 10*3/uL (ref 1.7–7.7)
Platelets: 261 10*3/uL (ref 150–400)
RBC: 3.83 MIL/uL — ABNORMAL LOW (ref 3.87–5.11)
RDW: 14.5 % (ref 11.5–15.5)
WBC: 6.7 10*3/uL (ref 4.0–10.5)

## 2013-02-12 MED ORDER — CYCLOBENZAPRINE HCL 10 MG PO TABS: 10.0000 mg | ORAL_TABLET | Freq: Two times a day (BID) | ORAL | Status: DC | PRN

## 2013-02-12 MED ORDER — IBUPROFEN 600 MG PO TABS: 600.0000 mg | ORAL_TABLET | Freq: Three times a day (TID) | ORAL | Status: DC | PRN

## 2013-02-19 DIAGNOSIS — M549 Dorsalgia, unspecified: Secondary | ICD-10-CM | POA: Insufficient documentation

## 2013-02-19 DIAGNOSIS — Z7982 Long term (current) use of aspirin: Secondary | ICD-10-CM | POA: Insufficient documentation

## 2013-02-19 DIAGNOSIS — R509 Fever, unspecified: Secondary | ICD-10-CM | POA: Insufficient documentation

## 2013-02-19 DIAGNOSIS — Z79899 Other long term (current) drug therapy: Secondary | ICD-10-CM | POA: Insufficient documentation

## 2013-02-19 DIAGNOSIS — R319 Hematuria, unspecified: Secondary | ICD-10-CM | POA: Insufficient documentation

## 2013-02-19 DIAGNOSIS — I10 Essential (primary) hypertension: Secondary | ICD-10-CM | POA: Insufficient documentation

## 2013-02-20 ENCOUNTER — Emergency Department (HOSPITAL_COMMUNITY): Payer: Self-pay

## 2013-02-20 ENCOUNTER — Emergency Department (HOSPITAL_COMMUNITY)
Admission: EM | Admit: 2013-02-20 | Discharge: 2013-02-20 | Disposition: A | Discharge status: Home / Self Care | Payer: Self-pay | Attending: Emergency Medicine | Admitting: Emergency Medicine

## 2013-02-20 ENCOUNTER — Encounter (HOSPITAL_COMMUNITY): Payer: Self-pay

## 2013-02-20 DIAGNOSIS — R509 Fever, unspecified: Secondary | ICD-10-CM

## 2013-02-20 DIAGNOSIS — R319 Hematuria, unspecified: Secondary | ICD-10-CM

## 2013-02-20 DIAGNOSIS — M549 Dorsalgia, unspecified: Secondary | ICD-10-CM

## 2013-02-20 LAB — CBC WITH DIFFERENTIAL/PLATELET
Basophils Absolute: 0 10*3/uL (ref 0.0–0.1)
Basophils Relative: 0 % (ref 0–1)
Hemoglobin: 11.5 g/dL — ABNORMAL LOW (ref 12.0–15.0)
MCHC: 32.1 g/dL (ref 30.0–36.0)
Monocytes Relative: 6 % (ref 3–12)
Neutro Abs: 8 10*3/uL — ABNORMAL HIGH (ref 1.7–7.7)
Neutrophils Relative %: 84 % — ABNORMAL HIGH (ref 43–77)

## 2013-02-20 LAB — URINE MICROSCOPIC-ADD ON

## 2013-02-20 LAB — URINALYSIS, ROUTINE W REFLEX MICROSCOPIC
Ketones, ur: NEGATIVE mg/dL
Leukocytes, UA: NEGATIVE
Nitrite: NEGATIVE
Specific Gravity, Urine: 1.025 (ref 1.005–1.030)
pH: 7 (ref 5.0–8.0)

## 2013-02-20 LAB — BASIC METABOLIC PANEL
BUN: 11 mg/dL (ref 6–23)
Chloride: 98 mEq/L (ref 96–112)
GFR calc Af Amer: 71 mL/min — ABNORMAL LOW (ref >90)
Potassium: 3.4 mEq/L — ABNORMAL LOW (ref 3.5–5.1)

## 2013-02-20 MED ORDER — CIPROFLOXACIN HCL 500 MG PO TABS: 500.0000 mg | ORAL_TABLET | Freq: Two times a day (BID) | ORAL | Status: DC

## 2013-02-20 MED ORDER — IBUPROFEN 800 MG PO TABS
800.0000 mg | ORAL_TABLET | Freq: Once | ORAL | Status: AC
  Administered 2013-02-20: 800 mg via ORAL
  Filled 2013-02-20: qty 1

## 2013-02-20 MED ORDER — MORPHINE SULFATE 4 MG/ML IJ SOLN
2.0000 mg | Freq: Once | INTRAMUSCULAR | Status: AC
  Administered 2013-02-20: 2 mg via INTRAVENOUS
  Filled 2013-02-20: qty 1

## 2013-02-20 MED ORDER — ONDANSETRON HCL 4 MG/2ML IJ SOLN
4.0000 mg | Freq: Once | INTRAMUSCULAR | Status: AC
  Administered 2013-02-20: 4 mg via INTRAVENOUS
  Filled 2013-02-20: qty 2

## 2013-02-20 MED ORDER — SODIUM CHLORIDE 0.9 % IV BOLUS (SEPSIS)
1000.0000 mL | Freq: Once | INTRAVENOUS | Status: AC
  Administered 2013-02-20: 1000 mL via INTRAVENOUS

## 2013-02-20 NOTE — ED Notes (Signed)
Pt states she has had back problems for 3 weeks, now with fever and pain around to her abd.  Pt denies diff urinating.  Pt states she has noted blood in her bowel movements

## 2013-02-20 NOTE — ED Provider Notes (Signed)
History    CSN: [id number] Arrival date & time 02/19/13  2354  First MD Initiated Contact with Patient 02/20/13 0107     Chief Complaint  Patient presents with  . Fever  . Back Pain   (Consider location/radiation/quality/duration/timing/severity/associated sxs/prior Treatment) HPI HPI Comments: Megan Barber is a 62 y.o. female who presents to the Emergency Department complaining of  Fever and lower back problems for three weeks. She began a fever yesterday and has been with more back pain since. Had a diarrhea stool earlier today. She has a h/o of kidney stones and was seen here on 7/16 and on 7/2 for similar pain. CT scan on 7/16 did not reveal a kidney stone. She denies urinary symptoms.  Past Medical History  Diagnosis Date  . Hypertension    Past Surgical History  Procedure Laterality Date  . Cholecystectomy     No family history on file. History  Substance Use Topics  . Smoking status: Never Smoker   . Smokeless tobacco: Not on file  . Alcohol Use: No   OB History   Grav Para Term Preterm Abortions TAB SAB Ect Mult Living                 Review of Systems  Constitutional: Negative for fever.       10 Systems reviewed and are negative for acute change except as noted in the HPI.  HENT: Negative for congestion.   Eyes: Negative for discharge and redness.  Respiratory: Negative for cough and shortness of breath.   Cardiovascular: Negative for chest pain.  Gastrointestinal: Positive for abdominal pain. Negative for vomiting.  Musculoskeletal: Positive for back pain.  Skin: Negative for rash.  Neurological: Negative for syncope, numbness and headaches.  Psychiatric/Behavioral:       No behavior change.    Allergies  Darvocet and Toradol  Home Medications   Current Outpatient Rx  Name  Route  Sig  Dispense  Refill  . acetaminophen (TYLENOL) 325 MG tablet   Oral   Take 650 mg by mouth every 6 (six) hours as needed for pain.         Marland Kitchen ibuprofen  (ADVIL,MOTRIN) 600 MG tablet   Oral   Take 1 tablet (600 mg total) by mouth every 8 (eight) hours as needed for pain.   15 tablet   0   . lisinopril (PRINIVIL,ZESTRIL) 10 MG tablet   Oral   Take 10 mg by mouth daily.         . Aspirin-Salicylamide-Caffeine (BC HEADACHE POWDER PO)   Oral   Take 1 each by mouth daily as needed (pain).         . cyclobenzaprine (FLEXERIL) 10 MG tablet   Oral   Take 1 tablet (10 mg total) by mouth 2 (two) times daily as needed for muscle spasms.   20 tablet   0   . methocarbamol (ROBAXIN) 500 MG tablet   Oral   Take 2 tablets (1,000 mg total) by mouth 4 (four) times daily as needed (muscle spasm in back).   60 tablet   0    BP 137/73  Pulse 80  Temp(Src) 102.2 F (39 C) (Oral)  Resp 20  Ht 5\' 4"  (1.626 m)  Wt 188 lb (85.276 kg)  BMI 32.25 kg/m2  SpO2 99% Physical Exam  Nursing note and vitals reviewed. Constitutional: She appears well-developed and well-nourished.  Awake, alert, nontoxic appearance.  HENT:  Head: Normocephalic and atraumatic.  Eyes: EOM are  normal. Pupils are equal, round, and reactive to light.  Neck: Normal range of motion. Neck supple.  Cardiovascular: Normal rate and intact distal pulses.   Pulmonary/Chest: Effort normal and breath sounds normal. She exhibits no tenderness.  Abdominal: Soft. Bowel sounds are normal. There is no tenderness. There is no rebound.  Musculoskeletal: She exhibits no tenderness.  Baseline ROM, no obvious new focal weakness.No spinal pain with percussion. No pain with palpation of the paraspinal muscles.  Neurological:  Mental status and motor strength appears baseline for patient and situation.  Skin: No rash noted.  Psychiatric: She has a normal mood and affect.    ED Course  Procedures (including critical care time) Results for orders placed during the hospital encounter of 02/20/13  URINALYSIS, ROUTINE W REFLEX MICROSCOPIC      Result Value Range   Color, Urine YELLOW   YELLOW   APPearance CLEAR  CLEAR   Specific Gravity, Urine 1.025  1.005 - 1.030   pH 7.0  5.0 - 8.0   Glucose, UA NEGATIVE  NEGATIVE mg/dL   Hgb urine dipstick LARGE (*) NEGATIVE   Bilirubin Urine NEGATIVE  NEGATIVE   Ketones, ur NEGATIVE  NEGATIVE mg/dL   Protein, ur 30 (*) NEGATIVE mg/dL   Urobilinogen, UA 0.2  0.0 - 1.0 mg/dL   Nitrite NEGATIVE  NEGATIVE   Leukocytes, UA NEGATIVE  NEGATIVE  CBC WITH DIFFERENTIAL      Result Value Range   WBC 9.5  4.0 - 10.5 K/uL   RBC 3.91  3.87 - 5.11 MIL/uL   Hemoglobin 11.5 (*) 12.0 - 15.0 g/dL   HCT 47.8 (*) 29.5 - 62.1 %   MCV 91.6  78.0 - 100.0 fL   MCH 29.4  26.0 - 34.0 pg   MCHC 32.1  30.0 - 36.0 g/dL   RDW 30.8  65.7 - 84.6 %   Platelets 240  150 - 400 K/uL   Neutrophils Relative % 84 (*) 43 - 77 %   Neutro Abs 8.0 (*) 1.7 - 7.7 K/uL   Lymphocytes Relative 9 (*) 12 - 46 %   Lymphs Abs 0.8  0.7 - 4.0 K/uL   Monocytes Relative 6  3 - 12 %   Monocytes Absolute 0.6  0.1 - 1.0 K/uL   Eosinophils Relative 1  0 - 5 %   Eosinophils Absolute 0.1  0.0 - 0.7 K/uL   Basophils Relative 0  0 - 1 %   Basophils Absolute 0.0  0.0 - 0.1 K/uL  BASIC METABOLIC PANEL      Result Value Range   Sodium 136  135 - 145 mEq/L   Potassium 3.4 (*) 3.5 - 5.1 mEq/L   Chloride 98  96 - 112 mEq/L   CO2 30  19 - 32 mEq/L   Glucose, Bld 112 (*) 70 - 99 mg/dL   BUN 11  6 - 23 mg/dL   Creatinine, Ser 9.62  0.50 - 1.10 mg/dL   Calcium 9.5  8.4 - 95.2 mg/dL   GFR calc non Af Amer 61 (*) >90 mL/min   GFR calc Af Amer 71 (*) >90 mL/min  URINE MICROSCOPIC-ADD ON      Result Value Range   Squamous Epithelial / LPF MANY (*) RARE   WBC, UA 0-2  <3 WBC/hpf   RBC / HPF 11-20  <3 RBC/hpf   Bacteria, UA MANY (*) RARE   Dg Abd Acute W/chest  02/20/2013   *RADIOLOGY REPORT*  Clinical Data: Abdominal pain.  ACUTE ABDOMEN SERIES (ABDOMEN 2 VIEW & CHEST 1 VIEW)  Comparison: CT abdomen and pelvis without contrast 02/12/2013.  Findings: The heart size is normal.  The lungs  are clear.  Mild rightward curvature is present in the thoracolumbar spine.  Gas is present throughout the colon.  A loop of small bowel is present in the left abdomen with fluid levels but no significant distention.  There is no free air.  Surgical clips are present in the gallbladder fossa.  Mild degenerative changes are noted in the lower lumbar spine and bilateral hips.  IMPRESSION:  1.  Gas filled loop of small bowel and fluid levels without distention in the left abdomen may represent a focal ileus. 2.  No other focal acute abnormality of the chest or abdomen.   Original Report Authenticated By: Marin Roberts, M.D.     MDM  Patient who presents with fever and back pain. UA with blood only. No sign of stones on CT 02/13/13.  Will treat with a course of Ciprofloxacin and have her follow up if back pain or fevers persist.Pt stable in ED with no significant deterioration in condition.The patient appears reasonably screened and/or stabilized for discharge and I doubt any other medical condition or other [facility] requiring further screening, evaluation, or treatment in the ED at this time prior to discharge.  MDM Reviewed: nursing note and vitals Interpretation: labs and x-ray     Nicoletta Dress. Colon Branch, MD 02/20/13 0400

## 2013-02-20 NOTE — ED Notes (Signed)
Pt alert & oriented x4, stable gait. Patient given discharge instructions, paperwork & prescription(s). Patient  instructed to stop at the registration desk to finish any additional paperwork. Patient verbalized understanding. Pt left department w/ no further questions. 

## 2013-03-18 ENCOUNTER — Emergency Department (HOSPITAL_COMMUNITY)
Admission: EM | Admit: 2013-03-18 | Discharge: 2013-03-18 | Disposition: A | Discharge status: Home / Self Care | Payer: Self-pay | Attending: Emergency Medicine | Admitting: Emergency Medicine

## 2013-03-18 ENCOUNTER — Encounter (HOSPITAL_COMMUNITY): Payer: Self-pay | Admitting: Emergency Medicine

## 2013-03-18 ENCOUNTER — Emergency Department (HOSPITAL_COMMUNITY): Payer: Self-pay

## 2013-03-18 DIAGNOSIS — R51 Headache: Secondary | ICD-10-CM | POA: Insufficient documentation

## 2013-03-18 DIAGNOSIS — R630 Anorexia: Secondary | ICD-10-CM | POA: Insufficient documentation

## 2013-03-18 DIAGNOSIS — R112 Nausea with vomiting, unspecified: Secondary | ICD-10-CM | POA: Insufficient documentation

## 2013-03-18 DIAGNOSIS — R42 Dizziness and giddiness: Secondary | ICD-10-CM | POA: Insufficient documentation

## 2013-03-18 DIAGNOSIS — Z79899 Other long term (current) drug therapy: Secondary | ICD-10-CM | POA: Insufficient documentation

## 2013-03-18 DIAGNOSIS — R1110 Vomiting, unspecified: Secondary | ICD-10-CM

## 2013-03-18 DIAGNOSIS — R5381 Other malaise: Secondary | ICD-10-CM | POA: Insufficient documentation

## 2013-03-18 DIAGNOSIS — I10 Essential (primary) hypertension: Secondary | ICD-10-CM | POA: Insufficient documentation

## 2013-03-18 LAB — BASIC METABOLIC PANEL
Calcium: 9.5 mg/dL (ref 8.4–10.5)
Creatinine, Ser: 0.77 mg/dL (ref 0.50–1.10)
GFR calc Af Amer: >90 mL/min (ref >90)
GFR calc non Af Amer: 89 mL/min — ABNORMAL LOW (ref >90)

## 2013-03-18 MED ORDER — HYDROMORPHONE HCL PF 1 MG/ML IJ SOLN
0.5000 mg | Freq: Once | INTRAMUSCULAR | Status: AC
  Administered 2013-03-18: 0.5 mg via INTRAVENOUS
  Filled 2013-03-18: qty 1

## 2013-03-18 MED ORDER — DIPHENHYDRAMINE HCL 50 MG/ML IJ SOLN
25.0000 mg | Freq: Once | INTRAMUSCULAR | Status: AC
  Administered 2013-03-18: 25 mg via INTRAVENOUS
  Filled 2013-03-18: qty 1

## 2013-03-18 MED ORDER — SODIUM CHLORIDE 0.9 % IV BOLUS (SEPSIS)
1000.0000 mL | Freq: Once | INTRAVENOUS | Status: AC
  Administered 2013-03-18: 1000 mL via INTRAVENOUS

## 2013-03-18 MED ORDER — ONDANSETRON 4 MG PO TBDP: ORAL_TABLET | ORAL | Status: DC

## 2013-03-18 MED ORDER — METOCLOPRAMIDE HCL 5 MG/ML IJ SOLN
10.0000 mg | Freq: Once | INTRAMUSCULAR | Status: AC
  Administered 2013-03-18: 10 mg via INTRAVENOUS
  Filled 2013-03-18: qty 2

## 2013-03-18 NOTE — ED Notes (Signed)
Standby assist patient to restroom. Steady gait.

## 2013-03-18 NOTE — ED Notes (Signed)
States that she started having a headache this morning around 0800 with nausea and vomiting.  Denies loss of consciousness.

## 2013-03-18 NOTE — ED Provider Notes (Signed)
CSN: [id number]     Arrival date & time 03/18/13  1543 History     First MD Initiated Contact with Patient 03/18/13 1706     Chief Complaint  Patient presents with  . Headache   (Consider location/radiation/quality/duration/timing/severity/associated sxs/prior Treatment) HPI Comments: 62 yo female with htn hx presents with right sided frontal HA that radiates down right posterior since waking this am.  Unsure how acute bc she woke up with the ha then it gradually worsened throughout the day with intermittent vomiting/ nausea and fatigue.  No focal neuro complaints except mild dizziness.  Pt tried to work but sxs continued.  No hx of brain bleeding or head injury.  Pt has had a similar ha but it has been a long time.  No fevers or neck stiffness.  Nothing improved  Patient is a 62 y.o. female presenting with headaches. The history is provided by the patient.  Headache Pain location:  Frontal Associated symptoms: fatigue, nausea and vomiting   Associated symptoms: no abdominal pain, no back pain, no fever, no neck pain, no neck stiffness and no seizures     Past Medical History  Diagnosis Date  . Hypertension    Past Surgical History  Procedure Laterality Date  . Cholecystectomy     No family history on file. History  Substance Use Topics  . Smoking status: Never Smoker   . Smokeless tobacco: Not on file  . Alcohol Use: No   OB History   Grav Para Term Preterm Abortions TAB SAB Ect Mult Living                 Review of Systems  Constitutional: Positive for appetite change and fatigue. Negative for fever and chills.  HENT: Negative for neck pain and neck stiffness.   Eyes: Negative for visual disturbance.  Respiratory: Negative for shortness of breath.   Cardiovascular: Negative for chest pain.  Gastrointestinal: Positive for nausea and vomiting. Negative for abdominal pain.  Genitourinary: Negative for dysuria and flank pain.  Musculoskeletal: Negative for back pain.   Skin: Negative for rash.  Neurological: Positive for headaches. Negative for seizures, syncope and light-headedness.    Allergies  Darvocet and Toradol  Home Medications   Current Outpatient Rx  Name  Route  Sig  Dispense  Refill  . acetaminophen (TYLENOL) 325 MG tablet   Oral   Take 650 mg by mouth every 6 (six) hours as needed for pain.         . Aspirin-Salicylamide-Caffeine (BC HEADACHE POWDER PO)   Oral   Take 1 each by mouth daily as needed (pain).         . ciprofloxacin (CIPRO) 500 MG tablet   Oral   Take 1 tablet (500 mg total) by mouth 2 (two) times daily.   14 tablet   0   . cyclobenzaprine (FLEXERIL) 10 MG tablet   Oral   Take 1 tablet (10 mg total) by mouth 2 (two) times daily as needed for muscle spasms.   20 tablet   0   . ibuprofen (ADVIL,MOTRIN) 600 MG tablet   Oral   Take 1 tablet (600 mg total) by mouth every 8 (eight) hours as needed for pain.   15 tablet   0   . lisinopril (PRINIVIL,ZESTRIL) 10 MG tablet   Oral   Take 10 mg by mouth daily.         . methocarbamol (ROBAXIN) 500 MG tablet   Oral   Take 2 tablets (  1,000 mg total) by mouth 4 (four) times daily as needed (muscle spasm in back).   60 tablet   0    BP 149/81  Pulse 85  Temp(Src) 98.3 F (36.8 C) (Oral)  Resp 20  Ht 5\' 5"  (1.651 m)  Wt 188 lb (85.276 kg)  BMI 31.28 kg/m2  SpO2 100% Physical Exam  Nursing note and vitals reviewed. Constitutional: She is oriented to person, place, and time. She appears well-developed and well-nourished.  HENT:  Head: Normocephalic and atraumatic.  Eyes: Conjunctivae are normal. Right eye exhibits no discharge. Left eye exhibits no discharge.  Neck: Normal range of motion. Neck supple. No tracheal deviation present.  Cardiovascular: Normal rate and regular rhythm.   Pulmonary/Chest: Effort normal and breath sounds normal.  Abdominal: Soft. She exhibits no distension. There is no tenderness. There is no guarding.  Musculoskeletal:  She exhibits no edema.  Neurological: She is alert and oriented to person, place, and time. No cranial nerve deficit. She displays a negative Romberg sign. Coordination normal. GCS eye subscore is 4. GCS verbal subscore is 5. GCS motor subscore is 6.  5+ strength in UE and LE with f/e at major joints. Sensation to palpation intact in UE and LE. CNs 2-12 grossly intact.  EOMFI.  PERRL.   Finger nose and coordination intact bilateral.   Visual fields intact to finger testing. Cautious gait.   Skin: Skin is warm. No rash noted.  Psychiatric: She has a normal mood and affect.    ED Course   Procedures (including critical care time)  Labs Reviewed  BASIC METABOLIC PANEL - Abnormal; Notable for the following:    GFR calc non Af Amer 89 (*)    All other components within normal limits   No results found. No diagnosis found.  MDM  Concern for ha and htn hx.  CT head to look for signs of bleeding. Similar to previous ha. Rechecked, pt improved. Pain meds given. Ct Head Wo Contrast  03/18/2013   *RADIOLOGY REPORT*  Clinical Data: Headache  CT HEAD WITHOUT CONTRAST  Technique:  Contiguous axial images were obtained from the base of the skull through the vertex without contrast.  Comparison: None.  Findings: No acute intracranial hemorrhage.  No focal mass lesion. No CT evidence of acute infarction.   No midline shift or mass effect.  No hydrocephalus.  Basilar cisterns are patent.  There is scattered gas within the cavernous sinus which extends into the right ophthalmic vein.  This is felt to relate to venipuncture for IV fluids (benign iatrogenic process).  Case discussed with the  neuroradiologist Dr . Benard Rink  who was in agreement.  There is an old blowout fracture of the medial wall of the right orbit.  The globes are normal.  Intraconal contents are normal other than the above described findings.  Mastoid air cells are clear.  IMPRESSION: No acute intracranial findings.   Original Report  Authenticated By: Genevive Bi, M.D.   Discussed possible causes of ha, discussed performing LP to decrease risk even further that this was not a small bleed. Pt has capacity to make decisions, feels improved and wishes to fup outpt.  Strict reasons to return given. She will be with family/ friends the next few days.  Normal neuro exam and gait in ED prior to discharge.   Pain meds given.     Enid Skeens, MD 03/18/13 2045

## 2013-07-20 ENCOUNTER — Encounter (HOSPITAL_COMMUNITY): Payer: Self-pay | Admitting: Emergency Medicine

## 2013-07-20 ENCOUNTER — Emergency Department (HOSPITAL_COMMUNITY)
Admission: EM | Admit: 2013-07-20 | Discharge: 2013-07-20 | Disposition: A | Discharge status: Home / Self Care | Payer: BC Managed Care – PPO | Attending: Emergency Medicine | Admitting: Emergency Medicine

## 2013-07-20 DIAGNOSIS — K006 Disturbances in tooth eruption: Secondary | ICD-10-CM | POA: Insufficient documentation

## 2013-07-20 DIAGNOSIS — K029 Dental caries, unspecified: Secondary | ICD-10-CM | POA: Insufficient documentation

## 2013-07-20 DIAGNOSIS — K0889 Other specified disorders of teeth and supporting structures: Secondary | ICD-10-CM

## 2013-07-20 DIAGNOSIS — Z98810 Dental sealant status: Secondary | ICD-10-CM | POA: Insufficient documentation

## 2013-07-20 DIAGNOSIS — I10 Essential (primary) hypertension: Secondary | ICD-10-CM | POA: Insufficient documentation

## 2013-07-20 DIAGNOSIS — K089 Disorder of teeth and supporting structures, unspecified: Secondary | ICD-10-CM | POA: Insufficient documentation

## 2013-07-20 DIAGNOSIS — Z79899 Other long term (current) drug therapy: Secondary | ICD-10-CM | POA: Insufficient documentation

## 2013-07-20 MED ORDER — TRAMADOL HCL 50 MG PO TABS: 50.0000 mg | ORAL_TABLET | Freq: Four times a day (QID) | ORAL | Status: DC | PRN

## 2013-07-20 MED ORDER — TRAMADOL HCL 50 MG PO TABS
50.0000 mg | ORAL_TABLET | Freq: Once | ORAL | Status: AC
  Administered 2013-07-20: 50 mg via ORAL
  Filled 2013-07-20: qty 1

## 2013-07-20 MED ORDER — AMOXICILLIN 250 MG PO CAPS
500.0000 mg | ORAL_CAPSULE | Freq: Once | ORAL | Status: AC
  Administered 2013-07-20: 500 mg via ORAL
  Filled 2013-07-20: qty 2

## 2013-07-20 MED ORDER — AMOXICILLIN 500 MG PO CAPS: 500.0000 mg | ORAL_CAPSULE | Freq: Three times a day (TID) | ORAL | Status: DC

## 2013-07-20 NOTE — ED Provider Notes (Signed)
Medical screening examination/treatment/procedure(s) were performed by non-physician practitioner and as supervising physician I was immediately available for consultation/collaboration.  EKG Interpretation   None      Devoria Albe, MD, Armando Gang   Ward Givens, MD 07/20/13 2236

## 2013-07-20 NOTE — ED Notes (Signed)
Pt states she woke up with upper R jaw dental pain. Pt reports several broken teeth. Has not been able to get in to see a dentist.

## 2013-07-20 NOTE — ED Provider Notes (Signed)
CSN: [id number]     Arrival date & time 07/20/13  2013 History   First MD Initiated Contact with Patient 07/20/13 2020     Chief Complaint  Patient presents with  . Dental Pain   (Consider location/radiation/quality/duration/timing/severity/associated sxs/prior Treatment) HPI Comments: Megan Barber is a 62 y.o. Female presenting with 1 day history of dental pain and gingival swelling.   The patient has a history of decay in the teeth involved which has recently started to cause worsening pain and now gingival swelling.  She states her right upper lateral incisor fractured to the gingival line about a month ago but has problems with all of her anterior upper teeth, her only remaing teeth as she has a lower denture and an upper partial of her molars and premolars.  There has been no fevers,  Chills, nausea or vomiting, also no complaint of difficulty swallowing,  Although chewing makes pain worse.  The patient has tried tylenol without relief of symptoms.         The history is provided by the patient.    Past Medical History  Diagnosis Date  . Hypertension    Past Surgical History  Procedure Laterality Date  . Cholecystectomy     Family History  Problem Relation Age of Onset  . Hypertension Mother   . Hypertension Father   . Hypertension Other    History  Substance Use Topics  . Smoking status: Never Smoker   . Smokeless tobacco: Not on file  . Alcohol Use: No   OB History   Grav Para Term Preterm Abortions TAB SAB Ect Mult Living   3 2 2  1  1         Review of Systems  Constitutional: Negative for fever.  HENT: Positive for dental problem. Negative for facial swelling and sore throat.   Respiratory: Negative for shortness of breath.   Musculoskeletal: Negative for neck pain and neck stiffness.    Allergies  Darvocet and Toradol  Home Medications   Current Outpatient Rx  Name  Route  Sig  Dispense  Refill  . acetaminophen (TYLENOL) 325 MG tablet   Oral    Take 650 mg by mouth every 6 (six) hours as needed for pain.         Marland Kitchen amoxicillin (AMOXIL) 500 MG capsule   Oral   Take 1 capsule (500 mg total) by mouth 3 (three) times daily.   30 capsule   0   . lisinopril (PRINIVIL,ZESTRIL) 10 MG tablet   Oral   Take 10 mg by mouth daily.         . ondansetron (ZOFRAN ODT) 4 MG disintegrating tablet      4mg  ODT q4 hours prn nausea/vomit   6 tablet   0   . traMADol (ULTRAM) 50 MG tablet   Oral   Take 1 tablet (50 mg total) by mouth every 6 (six) hours as needed.   15 tablet   0    BP 131/90  Pulse 117  Temp(Src) 98.2 F (36.8 C) (Oral)  Resp 16  Ht 5\' 4"  (1.626 m)  Wt 188 lb (85.276 kg)  BMI 32.25 kg/m2  SpO2 100% Physical Exam  Constitutional: She is oriented to person, place, and time. She appears well-developed and well-nourished. No distress.  HENT:  Head: Normocephalic and atraumatic.  Right Ear: Tympanic membrane and external ear normal.  Left Ear: Tympanic membrane and external ear normal.  Mouth/Throat: Oropharynx is clear and moist and mucous  membranes are normal. She has dentures. No oral lesions. No trismus in the jaw. Abnormal dentition.  Upper partial in place for molars and premolar teeth.  She has moderate decay of remaining upper teeth.  Missing right upper lateral incisor with gingiva edema and erythema,  No drainage, no fluctuance.    Eyes: Conjunctivae are normal.  Neck: Normal range of motion. Neck supple.  Cardiovascular: Normal rate and normal heart sounds.   Pulmonary/Chest: Effort normal.  Abdominal: She exhibits no distension.  Musculoskeletal: Normal range of motion.  Lymphadenopathy:    She has no cervical adenopathy.  Neurological: She is alert and oriented to person, place, and time.  Skin: Skin is warm and dry. No erythema.  Psychiatric: She has a normal mood and affect.    ED Course  Procedures (including critical care time) Labs Review Labs Reviewed - No data to display Imaging  Review No results found.  EKG Interpretation   None       MDM   1. Pain, dental    Amoxil, tramadol,  Dental referrals given.  She states has appt with dentist in [place], but not until end of January.  Given other numbers in hopes can be seen sooner.    Burgess Amor, PA-C 07/20/13 [phone]

## 2013-08-19 ENCOUNTER — Other Ambulatory Visit (HOSPITAL_COMMUNITY): Payer: Self-pay | Admitting: Family Medicine

## 2013-08-19 DIAGNOSIS — Z139 Encounter for screening, unspecified: Secondary | ICD-10-CM

## 2013-08-25 ENCOUNTER — Emergency Department (HOSPITAL_COMMUNITY)
Admission: EM | Admit: 2013-08-25 | Discharge: 2013-08-25 | Disposition: A | Discharge status: Home / Self Care | Payer: BC Managed Care – PPO | Attending: Emergency Medicine | Admitting: Emergency Medicine

## 2013-08-25 ENCOUNTER — Encounter (HOSPITAL_COMMUNITY): Payer: Self-pay | Admitting: Emergency Medicine

## 2013-08-25 ENCOUNTER — Emergency Department (HOSPITAL_COMMUNITY): Payer: BC Managed Care – PPO

## 2013-08-25 DIAGNOSIS — R112 Nausea with vomiting, unspecified: Secondary | ICD-10-CM | POA: Insufficient documentation

## 2013-08-25 DIAGNOSIS — R51 Headache: Secondary | ICD-10-CM | POA: Insufficient documentation

## 2013-08-25 DIAGNOSIS — H53149 Visual discomfort, unspecified: Secondary | ICD-10-CM | POA: Insufficient documentation

## 2013-08-25 DIAGNOSIS — Z792 Long term (current) use of antibiotics: Secondary | ICD-10-CM | POA: Insufficient documentation

## 2013-08-25 DIAGNOSIS — R519 Headache, unspecified: Secondary | ICD-10-CM

## 2013-08-25 DIAGNOSIS — Z79899 Other long term (current) drug therapy: Secondary | ICD-10-CM | POA: Insufficient documentation

## 2013-08-25 DIAGNOSIS — I10 Essential (primary) hypertension: Secondary | ICD-10-CM | POA: Insufficient documentation

## 2013-08-25 MED ORDER — METOCLOPRAMIDE HCL 5 MG/ML IJ SOLN
10.0000 mg | Freq: Once | INTRAMUSCULAR | Status: AC
  Administered 2013-08-25: 10 mg via INTRAVENOUS
  Filled 2013-08-25: qty 2

## 2013-08-25 MED ORDER — DIPHENHYDRAMINE HCL 50 MG/ML IJ SOLN
25.0000 mg | Freq: Once | INTRAMUSCULAR | Status: AC
  Administered 2013-08-25: 25 mg via INTRAVENOUS
  Filled 2013-08-25: qty 1

## 2013-08-25 MED ORDER — DEXAMETHASONE SODIUM PHOSPHATE 4 MG/ML IJ SOLN
10.0000 mg | Freq: Once | INTRAMUSCULAR | Status: AC
  Administered 2013-08-25: 10 mg via INTRAVENOUS
  Filled 2013-08-25: qty 3

## 2013-08-25 MED ORDER — SODIUM CHLORIDE 0.9 % IV BOLUS (SEPSIS)
1000.0000 mL | Freq: Once | INTRAVENOUS | Status: AC
  Administered 2013-08-25: 1000 mL via INTRAVENOUS

## 2013-08-25 NOTE — ED Notes (Signed)
Migraine HA started Friday night.  Began w/blurred vision.  She has hx of migraines but has not had one in 2-3 years and is on no meds for them.  10/10 pain, frontal location, stabbing in quality.  Has taken Ultram x 1, Ibuprofen and sinus OTC med w/out effect.  Nausea associated.

## 2013-08-25 NOTE — Discharge Instructions (Signed)
Migraine Headache A migraine headache is an intense, throbbing pain on one or both sides of your head. A migraine can last for 30 minutes to several hours. CAUSES  The exact cause of a migraine headache is not always known. However, a migraine may be caused when nerves in the brain become irritated and release chemicals that cause inflammation. This causes pain. Certain things may also trigger migraines, such as:  Alcohol.  Smoking.  Stress.  Menstruation.  Aged cheeses.  Foods or drinks that contain nitrates, glutamate, aspartame, or tyramine.  Lack of sleep.  Chocolate.  Caffeine.  Hunger.  Physical exertion.  Fatigue.  Medicines used to treat chest pain (nitroglycerine), birth control pills, estrogen, and some blood pressure medicines. SIGNS AND SYMPTOMS  Pain on one or both sides of your head.  Pulsating or throbbing pain.  Severe pain that prevents daily activities.  Pain that is aggravated by any physical activity.  Nausea, vomiting, or both.  Dizziness.  Pain with exposure to bright lights, loud noises, or activity.  General sensitivity to bright lights, loud noises, or smells. Before you get a migraine, you may get warning signs that a migraine is coming (aura). An aura may include:  Seeing flashing lights.  Seeing bright spots, halos, or zig-zag lines.  Having tunnel vision or blurred vision.  Having feelings of numbness or tingling.  Having trouble talking.  Having muscle weakness. DIAGNOSIS  A migraine headache is often diagnosed based on:  Symptoms.  Physical exam.  A CT scan or MRI of your head. These imaging tests cannot diagnose migraines, but they can help rule out other causes of headaches. TREATMENT Medicines may be given for pain and nausea. Medicines can also be given to help prevent recurrent migraines.  HOME CARE INSTRUCTIONS  Only take over-the-counter or prescription medicines for pain or discomfort as directed by your  health care provider. The use of long-term narcotics is not recommended.  Lie down in a dark, quiet room when you have a migraine.  Keep a journal to find out what may trigger your migraine headaches. For example, write down:  What you eat and drink.  How much sleep you get.  Any change to your diet or medicines.  Limit alcohol consumption.  Quit smoking if you smoke.  Get 7 9 hours of sleep, or as recommended by your health care provider.  Limit stress.  Keep lights dim if bright lights bother you and make your migraines worse. SEEK IMMEDIATE MEDICAL CARE IF:   Your migraine becomes severe.  You have a fever.  You have a stiff neck.  You have vision loss.  You have muscular weakness or loss of muscle control.  You start losing your balance or have trouble walking.  You feel faint or pass out.  You have severe symptoms that are different from your first symptoms. MAKE SURE YOU:   Understand these instructions.  Will watch your condition.  Will get help right away if you are not doing well or get worse. Document Released: 07/18/2005 Document Revised: 05/08/2013 Document Reviewed: 03/25/2013 [employer] Patient Information 2014 [employer].  

## 2013-08-25 NOTE — ED Provider Notes (Signed)
CSN: [id number]     Arrival date & time 08/25/13  0606 History  This chart was scribed for Megan Essex, MD by Roxan Diesel, ED scribe.  This patient was seen in room APA12/APA12 and the patient's care was started at 7:09 AM.   Chief Complaint  Patient presents with  . Migraine    The history is provided by the patient. No language interpreter was used.    HPI Comments: Megan Barber is a 63 y.o. female with h/o HTN who presents to the Emergency Department complaining of a gradual-onset HA that began 3 days ago.  Pt localizes pain mainly to the frontal area.  HA is associated with several episodes of nausea and vomiting as well as photophobia.  She denies fever.  Pt states HA feels like previous migraines but she has not had one in several years.  She denies focal weakness, numbness or tingling.  She has attempted to treat pain with Ultram and ibuprofen without relief.  She denies thunderclap onset.  She states compliance with BP medications.  She denies neck pain or stiffness.     Past Medical History  Diagnosis Date  . Hypertension     Past Surgical History  Procedure Laterality Date  . Cholecystectomy      Family History  Problem Relation Age of Onset  . Hypertension Mother   . Hypertension Father   . Hypertension Other     History  Substance Use Topics  . Smoking status: Never Smoker   . Smokeless tobacco: Not on file  . Alcohol Use: No    OB History   Grav Para Term Preterm Abortions TAB SAB Ect Mult Living   3 2 2  1  1          Review of Systems A complete 10 system review of systems was obtained and all systems are negative except as noted in the HPI and PMH.    Allergies  Darvocet and Toradol  Home Medications   Current Outpatient Rx  Name  Route  Sig  Dispense  Refill  . acetaminophen (TYLENOL) 325 MG tablet   Oral   Take 650 mg by mouth every 6 (six) hours as needed for pain.         Marland Kitchen amoxicillin (AMOXIL) 500 MG capsule   Oral   Take 1  capsule (500 mg total) by mouth 3 (three) times daily.   30 capsule   0   . lisinopril (PRINIVIL,ZESTRIL) 10 MG tablet   Oral   Take 10 mg by mouth daily.         . ondansetron (ZOFRAN ODT) 4 MG disintegrating tablet      4mg  ODT q4 hours prn nausea/vomit   6 tablet   0   . traMADol (ULTRAM) 50 MG tablet   Oral   Take 1 tablet (50 mg total) by mouth every 6 (six) hours as needed.   15 tablet   0    BP 170/87  Pulse 89  Temp(Src) 98.1 F (36.7 C) (Oral)  Ht 5\' 5"  (1.651 m)  Wt 198 lb (89.812 kg)  BMI 32.95 kg/m2  SpO2 100%  Physical Exam  Nursing note and vitals reviewed. Constitutional: She is oriented to person, place, and time. She appears well-developed and well-nourished. No distress.  HENT:  Head: Normocephalic and atraumatic.  Mouth/Throat: Oropharynx is clear and moist. No oropharyngeal exudate.  Eyes: Conjunctivae and EOM are normal. Pupils are equal, round, and reactive to light.  Photophobic  Neck: Normal range of motion. Neck supple. No tracheal deviation present.  No meningismus  Cardiovascular: Normal rate, regular rhythm and normal heart sounds.   No murmur heard. Pulmonary/Chest: Effort normal. No respiratory distress.  Abdominal: Soft. There is no tenderness. There is no rebound and no guarding.  Musculoskeletal: Normal range of motion. She exhibits no edema and no tenderness.  Neurological: She is alert and oriented to person, place, and time. No cranial nerve deficit. She exhibits normal muscle tone. Coordination normal.  CN 2-12 intact, no ataxia on finger to nose, no nystagmus, 5/5 strength throughout, no pronator drift, Romberg negative, normal gait.   Skin: Skin is warm and dry.  Psychiatric: She has a normal mood and affect. Her behavior is normal.    ED Course  Procedures (including critical care time)  DIAGNOSTIC STUDIES: Oxygen Saturation is 100% on room air, normal by my interpretation.    COORDINATION OF CARE: 7:14 AM-Discussed  treatment plan which includes migraine cocktail and head CT with pt at bedside and pt agreed to plan.     Labs Review Labs Reviewed - No data to display   Imaging Review Ct Head Wo Contrast  08/25/2013   CLINICAL DATA:  Severe frontal headache.  Hypertension.  EXAM: CT HEAD WITHOUT CONTRAST  TECHNIQUE: Contiguous axial images were obtained from the base of the skull through the vertex without intravenous contrast.  COMPARISON:  03/18/2013  FINDINGS: No evidence of intracranial hemorrhage, brain edema, or other signs of acute infarction. No evidence of intracranial mass lesion or mass effect. No abnormal extraaxial fluid collections identified. Ventricles are normal in size. No skull abnormality identified. Bold fracture deformity of the medial right orbital wall again noted.  IMPRESSION: Negative noncontrast head CT.   Electronically Signed   By: Earle Gell M.D.   On: 08/25/2013 08:26    EKG Interpretation   None       MDM   1. Headache   2. Hypertension    3 day history of progressively worsening headache.  Hx HTN, states compliance.  Hx migraines but has not had one in several years.  Denies thunderclap onset.  Associated with nausea, vomiting, photophobia.  Headache is improved with treatment. CT head is negative. Neurological exam is nonfocal. Discussed with patient the onset of her headache again. She confirms that started on Friday night and progressively and gradually worsened. She denies sudden thunderclap onset. Discussed etiology of SAH with patient and need for accurate description of headache onset. She confirms that is was indeed gradual onset. Patient states she does not want a lumbar puncture. I do not feel this headache represents subarachnoid hemorrhage given the gradual onset and a lumbar puncture does not seem to be indicated.  Patient tolerating by mouth ambulatory. She states she is feeling better and wishes to go home. Return precautions discussed including  worsening headache, dizziness, weakness, numbness, tingling or any other concerns. She is compliances blood pressure medications.  BP 157/88  Pulse 73  Temp(Src) 98.1 F (36.7 C) (Oral)  Resp 18  Ht 5\' 5"  (1.651 m)  Wt 198 lb (89.812 kg)  BMI 32.95 kg/m2  SpO2 96%   I personally performed the services described in this documentation, which was scribed in my presence. The recorded information has been reviewed and is accurate.    Megan Essex, MD 08/25/13 [phone]

## 2013-08-25 NOTE — ED Notes (Signed)
Patient with no complaints at this time. Respirations even and unlabored. Skin warm/dry. Discharge instructions reviewed with patient at this time. Patient given opportunity to voice concerns/ask questions. IV removed per policy and band-aid applied to site. Patient discharged at this time and left Emergency Department with steady gait.  

## 2013-09-06 ENCOUNTER — Ambulatory Visit (HOSPITAL_COMMUNITY): Payer: BC Managed Care – PPO

## 2014-03-17 ENCOUNTER — Other Ambulatory Visit (HOSPITAL_COMMUNITY): Payer: Self-pay | Admitting: Orthopaedic Surgery

## 2014-03-17 DIAGNOSIS — M25562 Pain in left knee: Secondary | ICD-10-CM

## 2014-03-19 ENCOUNTER — Ambulatory Visit (HOSPITAL_COMMUNITY)
Admission: RE | Admit: 2014-03-19 | Discharge: 2014-03-19 | Disposition: A | Discharge status: Home / Self Care | Payer: BC Managed Care – PPO | Source: Ambulatory Visit | Attending: Orthopaedic Surgery | Admitting: Orthopaedic Surgery

## 2014-03-19 DIAGNOSIS — M25569 Pain in unspecified knee: Secondary | ICD-10-CM | POA: Diagnosis not present

## 2014-03-19 DIAGNOSIS — M7120 Synovial cyst of popliteal space [Baker], unspecified knee: Secondary | ICD-10-CM | POA: Diagnosis not present

## 2014-03-19 DIAGNOSIS — M2240 Chondromalacia patellae, unspecified knee: Secondary | ICD-10-CM | POA: Diagnosis not present

## 2014-03-19 DIAGNOSIS — M25669 Stiffness of unspecified knee, not elsewhere classified: Secondary | ICD-10-CM | POA: Diagnosis not present

## 2014-03-19 DIAGNOSIS — M25562 Pain in left knee: Secondary | ICD-10-CM

## 2014-05-24 ENCOUNTER — Encounter (HOSPITAL_COMMUNITY): Payer: Self-pay | Admitting: Emergency Medicine

## 2014-05-24 ENCOUNTER — Emergency Department (HOSPITAL_COMMUNITY): Payer: BC Managed Care – PPO

## 2014-05-24 ENCOUNTER — Emergency Department (HOSPITAL_COMMUNITY)
Admission: EM | Admit: 2014-05-24 | Discharge: 2014-05-24 | Disposition: A | Discharge status: Home / Self Care | Payer: BC Managed Care – PPO | Attending: Emergency Medicine | Admitting: Emergency Medicine

## 2014-05-24 DIAGNOSIS — R112 Nausea with vomiting, unspecified: Secondary | ICD-10-CM | POA: Insufficient documentation

## 2014-05-24 DIAGNOSIS — I10 Essential (primary) hypertension: Secondary | ICD-10-CM | POA: Diagnosis not present

## 2014-05-24 DIAGNOSIS — Z79899 Other long term (current) drug therapy: Secondary | ICD-10-CM | POA: Diagnosis not present

## 2014-05-24 DIAGNOSIS — N390 Urinary tract infection, site not specified: Secondary | ICD-10-CM | POA: Insufficient documentation

## 2014-05-24 DIAGNOSIS — R140 Abdominal distension (gaseous): Secondary | ICD-10-CM | POA: Insufficient documentation

## 2014-05-24 DIAGNOSIS — R109 Unspecified abdominal pain: Secondary | ICD-10-CM

## 2014-05-24 DIAGNOSIS — R1084 Generalized abdominal pain: Secondary | ICD-10-CM | POA: Diagnosis present

## 2014-05-24 LAB — COMPREHENSIVE METABOLIC PANEL
ALBUMIN: 3.2 g/dL — AB (ref 3.5–5.2)
ALK PHOS: 43 U/L (ref 39–117)
ALT: 8 U/L (ref 0–35)
ANION GAP: 9 (ref 5–15)
AST: 14 U/L (ref 0–37)
BUN: 16 mg/dL (ref 6–23)
CALCIUM: 8.8 mg/dL (ref 8.4–10.5)
CO2: 30 mEq/L (ref 19–32)
CREATININE: 1.12 mg/dL — AB (ref 0.50–1.10)
Chloride: 100 mEq/L (ref 96–112)
GFR calc non Af Amer: 52 mL/min — ABNORMAL LOW (ref >90)
GFR, EST AFRICAN AMERICAN: 60 mL/min — AB (ref >90)
GLUCOSE: 86 mg/dL (ref 70–99)
Potassium: 3.2 mEq/L — ABNORMAL LOW (ref 3.7–5.3)
Sodium: 139 mEq/L (ref 137–147)
TOTAL PROTEIN: 5.7 g/dL — AB (ref 6.0–8.3)
Total Bilirubin: 0.2 mg/dL — ABNORMAL LOW (ref 0.3–1.2)

## 2014-05-24 LAB — URINE MICROSCOPIC-ADD ON

## 2014-05-24 LAB — CBC WITH DIFFERENTIAL/PLATELET
BASOS PCT: 1 % (ref 0–1)
Basophils Absolute: 0 10*3/uL (ref 0.0–0.1)
EOS PCT: 2 % (ref 0–5)
Eosinophils Absolute: 0.1 10*3/uL (ref 0.0–0.7)
HCT: 34.2 % — ABNORMAL LOW (ref 36.0–46.0)
HEMOGLOBIN: 11.2 g/dL — AB (ref 12.0–15.0)
LYMPHS ABS: 2.1 10*3/uL (ref 0.7–4.0)
Lymphocytes Relative: 37 % (ref 12–46)
MCH: 28.8 pg (ref 26.0–34.0)
MCHC: 32.7 g/dL (ref 30.0–36.0)
MCV: 87.9 fL (ref 78.0–100.0)
MONO ABS: 0.4 10*3/uL (ref 0.1–1.0)
MONOS PCT: 8 % (ref 3–12)
NEUTROS PCT: 52 % (ref 43–77)
Neutro Abs: 3 10*3/uL (ref 1.7–7.7)
Platelets: 247 10*3/uL (ref 150–400)
RBC: 3.89 MIL/uL (ref 3.87–5.11)
RDW: 13.2 % (ref 11.5–15.5)
WBC: 5.6 10*3/uL (ref 4.0–10.5)

## 2014-05-24 LAB — URINALYSIS, ROUTINE W REFLEX MICROSCOPIC
Bilirubin Urine: NEGATIVE
Glucose, UA: NEGATIVE mg/dL
Hgb urine dipstick: NEGATIVE
Ketones, ur: NEGATIVE mg/dL
LEUKOCYTES UA: NEGATIVE
NITRITE: POSITIVE — AB
PH: 5.5 (ref 5.0–8.0)
Protein, ur: NEGATIVE mg/dL
Urobilinogen, UA: 0.2 mg/dL (ref 0.0–1.0)

## 2014-05-24 LAB — LIPASE, BLOOD: Lipase: 13 U/L (ref 11–59)

## 2014-05-24 MED ORDER — IOHEXOL 300 MG/ML  SOLN
50.0000 mL | Freq: Once | INTRAMUSCULAR | Status: AC | PRN
  Administered 2014-05-24: 50 mL via ORAL

## 2014-05-24 MED ORDER — HYDROCODONE-ACETAMINOPHEN 5-325 MG PO TABS: 1.0000 | ORAL_TABLET | Freq: Four times a day (QID) | ORAL | Status: DC | PRN

## 2014-05-24 MED ORDER — DEXTROSE 5 % IV SOLN
1.0000 g | Freq: Once | INTRAVENOUS | Status: AC
  Administered 2014-05-24: 1 g via INTRAVENOUS
  Filled 2014-05-24: qty 10

## 2014-05-24 MED ORDER — IOHEXOL 300 MG/ML  SOLN
100.0000 mL | Freq: Once | INTRAMUSCULAR | Status: AC | PRN
  Administered 2014-05-24: 100 mL via INTRAVENOUS

## 2014-05-24 MED ORDER — ONDANSETRON HCL 4 MG/2ML IJ SOLN
4.0000 mg | Freq: Once | INTRAMUSCULAR | Status: AC
  Administered 2014-05-24: 4 mg via INTRAVENOUS
  Filled 2014-05-24: qty 2

## 2014-05-24 MED ORDER — SODIUM CHLORIDE 0.9 % IV BOLUS (SEPSIS)
500.0000 mL | Freq: Once | INTRAVENOUS | Status: AC
  Administered 2014-05-24: 500 mL via INTRAVENOUS

## 2014-05-24 MED ORDER — PROMETHAZINE HCL 25 MG PO TABS: 25.0000 mg | ORAL_TABLET | Freq: Four times a day (QID) | ORAL | Status: DC | PRN

## 2014-05-24 MED ORDER — HYDROMORPHONE HCL 1 MG/ML IJ SOLN
1.0000 mg | Freq: Once | INTRAMUSCULAR | Status: AC
  Administered 2014-05-24: 1 mg via INTRAVENOUS
  Filled 2014-05-24: qty 1

## 2014-05-24 MED ORDER — CEPHALEXIN 500 MG PO CAPS: 500.0000 mg | ORAL_CAPSULE | Freq: Four times a day (QID) | ORAL | Status: DC

## 2014-05-24 MED ORDER — SODIUM CHLORIDE 0.9 % IV SOLN: INTRAVENOUS | Status: DC

## 2014-05-24 NOTE — ED Provider Notes (Signed)
CSN: [id number]     Arrival date & time 05/24/14  9833 History  This chart was scribed for Megan Sorrow, MD by Marlowe Kays, ED Scribe. This patient was seen in room APA03/APA03 and the patient's care was started at 11:13 AM.  Chief Complaint  Patient presents with  . Abdominal Pain   Patient is a 63 y.o. female presenting with abdominal pain. The history is provided by the patient. No language interpreter was used.  Abdominal Pain Pain location:  Generalized Pain quality: sharp   Pain radiates to:  Does not radiate Pain severity:  Severe Onset quality:  Gradual Duration:  3 weeks Timing:  Constant Progression:  Unchanged Chronicity:  New Relieved by:  Nothing Worsened by:  Eating Ineffective treatments:  None tried Associated symptoms: chills, nausea and vomiting   Associated symptoms: no chest pain, no cough, no diarrhea, no dysuria, no fever, no shortness of breath and no sore throat    HPI Comments:  Megan Barber is a 63 y.o. female who presents to the Emergency Department complaining of new onset, diffuse, nonradiating abdominal pain that began about three weeks ago. She reports associated chills and vomiting with two episode of clear liquid yesterday. She reports the pain is 9/10. Eating and drinking makes the pain worse and induces immediately. She denies fever, rhinorrhea, sore throat, visual changes, cough, SOB, CP, leg swelling, diarrhea, back pain, rash or HA.   Past Medical History  Diagnosis Date  . Hypertension    Past Surgical History  Procedure Laterality Date  . Cholecystectomy     Family History  Problem Relation Age of Onset  . Hypertension Mother   . Hypertension Father   . Hypertension Other    History  Substance Use Topics  . Smoking status: Never Smoker   . Smokeless tobacco: Not on file  . Alcohol Use: No   OB History   Grav Para Term Preterm Abortions TAB SAB Ect Mult Living   3 2 2  1  1         Review of Systems  Constitutional:  Positive for chills. Negative for fever.  HENT: Negative for rhinorrhea and sore throat.   Eyes: Negative for visual disturbance.  Respiratory: Negative for cough and shortness of breath.   Cardiovascular: Negative for chest pain and leg swelling.  Gastrointestinal: Positive for nausea, vomiting and abdominal pain. Negative for diarrhea.  Genitourinary: Negative for dysuria.  Musculoskeletal: Negative for back pain.  Skin: Negative for rash.  Neurological: Negative for headaches.  Hematological: Does not bruise/bleed easily.    Allergies  Darvocet and Toradol  Home Medications   Prior to Admission medications   Medication Sig Start Date End Date Taking? Authorizing Provider  lisinopril-hydrochlorothiazide (PRINZIDE,ZESTORETIC) 10-12.5 MG per tablet Take 1 tablet by mouth at bedtime.    Yes Historical Provider, MD  acetaminophen (TYLENOL) 325 MG tablet Take 650 mg by mouth every 6 (six) hours as needed for pain.    Historical Provider, MD  cephALEXin (KEFLEX) 500 MG capsule Take 1 capsule (500 mg total) by mouth 4 (four) times daily. 05/24/14   Megan Sorrow, MD  HYDROcodone-acetaminophen (NORCO/VICODIN) 5-325 MG per tablet Take 1-2 tablets by mouth every 6 (six) hours as needed. 05/24/14   Megan Sorrow, MD  oxyCODONE-acetaminophen (PERCOCET) 7.5-325 MG per tablet Take 1 tablet by mouth every 6 (six) hours as needed. pain 05/14/14   Historical Provider, MD  promethazine (PHENERGAN) 25 MG tablet Take 1 tablet (25 mg total) by mouth every 6 (  six) hours as needed. 05/24/14   Megan Sorrow, MD   Triage Vitals: Pulse 92  Temp(Src) 97.6 F (36.4 C)  Resp 16  Ht 5\' 5"  (1.651 m)  Wt 172 lb (78.019 kg)  BMI 28.62 kg/m2  SpO2 100% Physical Exam  Nursing note and vitals reviewed. Constitutional: She is oriented to person, place, and time. She appears well-developed and well-nourished.  HENT:  Head: Normocephalic and atraumatic.  Mouth/Throat: Mucous membranes are normal.  Eyes:  EOM are normal.  Neck: Normal range of motion.  Cardiovascular: Normal rate, regular rhythm and normal heart sounds.   No murmur heard. Pulmonary/Chest: Effort normal and breath sounds normal. No respiratory distress. She has no wheezes. She has no rales.  Abdominal: Soft. Bowel sounds are normal. She exhibits distension. There is no tenderness.  Musculoskeletal: Normal range of motion. She exhibits no edema.  No ankle swelling.  Neurological: She is alert and oriented to person, place, and time. No cranial nerve deficit. She exhibits normal muscle tone. Coordination normal.  Skin: Skin is warm and dry.  Psychiatric: She has a normal mood and affect. Her behavior is normal.    ED Course  Procedures (including critical care time) DIAGNOSTIC STUDIES: Oxygen Saturation is 100% on RA, normal by my interpretation.   COORDINATION OF CARE: 11:16 AM- Will CT abdomen and order nausea medication. Pt verbalizes understanding and agrees to plan.  Medications  0.9 %  sodium chloride infusion (not administered)  cefTRIAXone (ROCEPHIN) 1 g in dextrose 5 % 50 mL IVPB (1 g Intravenous New Bag/Given 05/24/14 1406)  sodium chloride 0.9 % bolus 500 mL (0 mLs Intravenous Stopped 05/24/14 1358)  ondansetron (ZOFRAN) injection 4 mg (4 mg Intravenous Given 05/24/14 1151)  HYDROmorphone (DILAUDID) injection 1 mg (1 mg Intravenous Given 05/24/14 1151)  iohexol (OMNIPAQUE) 300 MG/ML solution 50 mL (50 mLs Oral Contrast Given 05/24/14 1146)  iohexol (OMNIPAQUE) 300 MG/ML solution 100 mL (100 mLs Intravenous Contrast Given 05/24/14 1235)    Labs Review Labs Reviewed  CBC WITH DIFFERENTIAL - Abnormal; Notable for the following:    Hemoglobin 11.2 (*)    HCT 34.2 (*)    All other components within normal limits  COMPREHENSIVE METABOLIC PANEL - Abnormal; Notable for the following:    Potassium 3.2 (*)    Creatinine, Ser 1.12 (*)    Total Protein 5.7 (*)    Albumin 3.2 (*)    Total Bilirubin 0.2 (*)    GFR  calc non Af Amer 52 (*)    GFR calc Af Amer 60 (*)    All other components within normal limits  URINALYSIS, ROUTINE W REFLEX MICROSCOPIC - Abnormal; Notable for the following:    Color, Urine AMBER (*)    APPearance CLOUDY (*)    Specific Gravity, Urine >1.030 (*)    Nitrite POSITIVE (*)    All other components within normal limits  URINE MICROSCOPIC-ADD ON - Abnormal; Notable for the following:    Squamous Epithelial / LPF MANY (*)    Bacteria, UA MANY (*)    All other components within normal limits  URINE CULTURE  LIPASE, BLOOD   Results for orders placed during the hospital encounter of 05/24/14  CBC WITH DIFFERENTIAL      Result Value Ref Range   WBC 5.6  4.0 - 10.5 K/uL   RBC 3.89  3.87 - 5.11 MIL/uL   Hemoglobin 11.2 (*) 12.0 - 15.0 g/dL   HCT 34.2 (*) 36.0 - 46.0 %   MCV  87.9  78.0 - 100.0 fL   MCH 28.8  26.0 - 34.0 pg   MCHC 32.7  30.0 - 36.0 g/dL   RDW 13.2  11.5 - 15.5 %   Platelets 247  150 - 400 K/uL   Neutrophils Relative % 52  43 - 77 %   Neutro Abs 3.0  1.7 - 7.7 K/uL   Lymphocytes Relative 37  12 - 46 %   Lymphs Abs 2.1  0.7 - 4.0 K/uL   Monocytes Relative 8  3 - 12 %   Monocytes Absolute 0.4  0.1 - 1.0 K/uL   Eosinophils Relative 2  0 - 5 %   Eosinophils Absolute 0.1  0.0 - 0.7 K/uL   Basophils Relative 1  0 - 1 %   Basophils Absolute 0.0  0.0 - 0.1 K/uL  COMPREHENSIVE METABOLIC PANEL      Result Value Ref Range   Sodium 139  137 - 147 mEq/L   Potassium 3.2 (*) 3.7 - 5.3 mEq/L   Chloride 100  96 - 112 mEq/L   CO2 30  19 - 32 mEq/L   Glucose, Bld 86  70 - 99 mg/dL   BUN 16  6 - 23 mg/dL   Creatinine, Ser 1.12 (*) 0.50 - 1.10 mg/dL   Calcium 8.8  8.4 - 10.5 mg/dL   Total Protein 5.7 (*) 6.0 - 8.3 g/dL   Albumin 3.2 (*) 3.5 - 5.2 g/dL   AST 14  0 - 37 U/L   ALT 8  0 - 35 U/L   Alkaline Phosphatase 43  39 - 117 U/L   Total Bilirubin 0.2 (*) 0.3 - 1.2 mg/dL   GFR calc non Af Amer 52 (*) >90 mL/min   GFR calc Af Amer 60 (*) >90 mL/min   Anion gap 9   5 - 15  URINALYSIS, ROUTINE W REFLEX MICROSCOPIC      Result Value Ref Range   Color, Urine AMBER (*) YELLOW   APPearance CLOUDY (*) CLEAR   Specific Gravity, Urine >1.030 (*) 1.005 - 1.030   pH 5.5  5.0 - 8.0   Glucose, UA NEGATIVE  NEGATIVE mg/dL   Hgb urine dipstick NEGATIVE  NEGATIVE   Bilirubin Urine NEGATIVE  NEGATIVE   Ketones, ur NEGATIVE  NEGATIVE mg/dL   Protein, ur NEGATIVE  NEGATIVE mg/dL   Urobilinogen, UA 0.2  0.0 - 1.0 mg/dL   Nitrite POSITIVE (*) NEGATIVE   Leukocytes, UA NEGATIVE  NEGATIVE  URINE MICROSCOPIC-ADD ON      Result Value Ref Range   Squamous Epithelial / LPF MANY (*) RARE   WBC, UA 0-2  <3 WBC/hpf   RBC / HPF 3-6  <3 RBC/hpf   Bacteria, UA MANY (*) RARE  LIPASE, BLOOD      Result Value Ref Range   Lipase 13  11 - 59 U/L     Imaging Review Ct Abdomen Pelvis W Contrast  05/24/2014   CLINICAL DATA:  63 year old female with nausea, vomiting and abdominal pain for the past 3 weeks.  EXAM: CT ABDOMEN AND PELVIS WITH CONTRAST  TECHNIQUE: Multidetector CT imaging of the abdomen and pelvis was performed using the standard protocol following bolus administration of intravenous contrast.  CONTRAST:  49mL OMNIPAQUE IOHEXOL 300 MG/ML SOLN, 119mL OMNIPAQUE IOHEXOL 300 MG/ML SOLN  COMPARISON:  CT of the abdomen and pelvis 02/12/2013.  FINDINGS: Lower chest:  Unremarkable.  Hepatobiliary: Status post cholecystectomy. Common bile duct measures 9 mm in the porta hepatis.  Minimal intrahepatic biliary ductal dilatation. No focal cystic or solid hepatic lesions.  Pancreas: Pancreas is normal in appearance.  Spleen: Unremarkable.  Adrenals/Urinary Tract: Normal appearance of the kidneys and adrenal glands bilaterally. No hydroureteronephrosis to indicate urinary tract obstruction. Urinary bladder is unremarkable in appearance.  Stomach/Bowel: Normal appearance of the stomach. No pathologic dilatation of the small bowel or colon. Normal appendix.  Vascular/Lymphatic: Mild  atherosclerotic disease in the abdominal and pelvic vasculature, without evidence of aneurysm or dissection. No pathologically enlarged lymph nodes are noted in the abdomen or pelvis.  Reproductive: Status post hysterectomy. Ovaries are not confidently identified may be surgically absent or atrophic.  Other: No significant volume of ascites.  No pneumoperitoneum.  Musculoskeletal: There are no aggressive appearing lytic or blastic lesions noted in the visualized portions of the skeleton.  IMPRESSION: 1. No definite acute findings in the abdomen or pelvis to account for the patient's symptoms. 2. There is very mild intrahepatic biliary ductal dilatation, and the common bile duct measures 9 mm in the porta hepatis. Given the patient's age and post cholecystectomy status, these findings are favored to reflect "functional" biliary dilatation, however, clinical correlation and correlation with liver function tests is recommended to exclude evidence of biliary tract obstruction. If there is clinical concern for biliary tract obstruction, further evaluation with MRCP could be performed to exclude the presence of a retained ductal stone or distal ductal structure. 3. Additional incidental findings, as above.   Electronically Signed   By: Vinnie Langton M.D.   On: 05/24/2014 13:14     EKG Interpretation None      MDM   Final diagnoses:  Abdominal pain  UTI (lower urinary tract infection)    Patient with complaint of abdominal pain for 3 weeks but worse lately. Associated with nausea and vomiting no diarrhea. CT scan negative labs negative except for positive nitrite on the urine consistent with urinary tract infection. But no significant white blood cells in the urine. Urine culture sent. Treated with 1 g of Rocephin for urinary tract infection here will be continued on Keflex at home. Also treatment with Phenergan and hydrocodone for the pain. Patient will return for any new or worse symptoms. Other CT  findings are consistent with the removal of her gallbladder. Liver function tests without significant abnormalities.  I personally performed the services described in this documentation, which was scribed in my presence. The recorded information has been reviewed and is accurate.    Megan Sorrow, MD 05/24/14 1432

## 2014-05-24 NOTE — ED Notes (Signed)
MD at bedside. 

## 2014-05-24 NOTE — Discharge Instructions (Signed)
Workup for the abdominal pain with negative CT scan. Labs normal except for evidence of urinary tract infection. Take the antibiotic as directed for the next 7 days. Take the Phenergan as needed for nausea and vomiting. Take hydrocodone as needed for pain. If not improved in 2 days return. Also return for any new or worse symptoms.

## 2014-05-24 NOTE — ED Notes (Signed)
Pt c/o intermittent abd pain/n/v x 3 weeks.

## 2014-05-26 LAB — URINE CULTURE

## 2014-05-27 ENCOUNTER — Telehealth (HOSPITAL_COMMUNITY): Payer: Self-pay

## 2014-05-27 NOTE — ED Notes (Signed)
Post ED Visit - Positive Culture Follow-up  Culture report reviewed by antimicrobial stewardship pharmacist: []  Wes Dulaney, Pharm.D., BCPS [x]  Heide Guile, Pharm.D., BCPS []  Alycia Rossetti, Pharm.D., BCPS []  [place], Pharm.D., BCPS, AAHIVP []  Legrand Como, Pharm.D., BCPS, AAHIVP []  Carly Sabat, Pharm.D. []  Elenor Quinones, Pharm.D.  Positive urine culture Treated with cephalexin, organism sensitive to the same and no further patient follow-up is required at this time.  Ileene Musa 05/27/2014, 10:19 AM

## 2014-06-02 ENCOUNTER — Encounter (HOSPITAL_COMMUNITY): Payer: Self-pay | Admitting: Emergency Medicine

## 2014-07-15 ENCOUNTER — Encounter (HOSPITAL_COMMUNITY): Payer: Self-pay | Admitting: Emergency Medicine

## 2014-07-15 ENCOUNTER — Emergency Department (HOSPITAL_COMMUNITY)
Admission: EM | Admit: 2014-07-15 | Discharge: 2014-07-15 | Disposition: A | Discharge status: Home / Self Care | Payer: BC Managed Care – PPO | Attending: Emergency Medicine | Admitting: Emergency Medicine

## 2014-07-15 ENCOUNTER — Emergency Department (HOSPITAL_COMMUNITY): Payer: BC Managed Care – PPO

## 2014-07-15 DIAGNOSIS — K5900 Constipation, unspecified: Secondary | ICD-10-CM

## 2014-07-15 DIAGNOSIS — I10 Essential (primary) hypertension: Secondary | ICD-10-CM | POA: Diagnosis not present

## 2014-07-15 DIAGNOSIS — R109 Unspecified abdominal pain: Secondary | ICD-10-CM | POA: Diagnosis present

## 2014-07-15 DIAGNOSIS — A599 Trichomoniasis, unspecified: Secondary | ICD-10-CM | POA: Insufficient documentation

## 2014-07-15 DIAGNOSIS — Z792 Long term (current) use of antibiotics: Secondary | ICD-10-CM | POA: Insufficient documentation

## 2014-07-15 DIAGNOSIS — F1110 Opioid abuse, uncomplicated: Secondary | ICD-10-CM | POA: Insufficient documentation

## 2014-07-15 DIAGNOSIS — Z79899 Other long term (current) drug therapy: Secondary | ICD-10-CM | POA: Diagnosis not present

## 2014-07-15 DIAGNOSIS — Z9089 Acquired absence of other organs: Secondary | ICD-10-CM | POA: Diagnosis not present

## 2014-07-15 LAB — URINALYSIS, ROUTINE W REFLEX MICROSCOPIC
Bilirubin Urine: NEGATIVE
Glucose, UA: NEGATIVE mg/dL
HGB URINE DIPSTICK: NEGATIVE
Ketones, ur: NEGATIVE mg/dL
Nitrite: NEGATIVE
PH: 7.5 (ref 5.0–8.0)
Protein, ur: NEGATIVE mg/dL
Specific Gravity, Urine: 1.015 (ref 1.005–1.030)
UROBILINOGEN UA: 0.2 mg/dL (ref 0.0–1.0)

## 2014-07-15 LAB — COMPREHENSIVE METABOLIC PANEL
ALK PHOS: 60 U/L (ref 39–117)
ALT: 9 U/L (ref 0–35)
ANION GAP: 9 (ref 5–15)
AST: 14 U/L (ref 0–37)
Albumin: 3.7 g/dL (ref 3.5–5.2)
BUN: 13 mg/dL (ref 6–23)
CO2: 30 mEq/L (ref 19–32)
Calcium: 9.3 mg/dL (ref 8.4–10.5)
Chloride: 99 mEq/L (ref 96–112)
Creatinine, Ser: 1.07 mg/dL (ref 0.50–1.10)
GFR, EST AFRICAN AMERICAN: 64 mL/min — AB (ref >90)
GFR, EST NON AFRICAN AMERICAN: 55 mL/min — AB (ref >90)
GLUCOSE: 91 mg/dL (ref 70–99)
POTASSIUM: 3.3 meq/L — AB (ref 3.7–5.3)
Sodium: 138 mEq/L (ref 137–147)
Total Bilirubin: <0.2 mg/dL — ABNORMAL LOW (ref 0.3–1.2)
Total Protein: 7.6 g/dL (ref 6.0–8.3)

## 2014-07-15 LAB — CBC WITH DIFFERENTIAL/PLATELET
BASOS ABS: 0 10*3/uL (ref 0.0–0.1)
BASOS PCT: 0 % (ref 0–1)
EOS ABS: 0.2 10*3/uL (ref 0.0–0.7)
Eosinophils Relative: 3 % (ref 0–5)
HCT: 34.3 % — ABNORMAL LOW (ref 36.0–46.0)
Hemoglobin: 10.8 g/dL — ABNORMAL LOW (ref 12.0–15.0)
LYMPHS ABS: 1.4 10*3/uL (ref 0.7–4.0)
Lymphocytes Relative: 23 % (ref 12–46)
MCH: 27.7 pg (ref 26.0–34.0)
MCHC: 31.5 g/dL (ref 30.0–36.0)
MCV: 87.9 fL (ref 78.0–100.0)
Monocytes Absolute: 0.7 10*3/uL (ref 0.1–1.0)
Monocytes Relative: 11 % (ref 3–12)
Neutro Abs: 3.8 10*3/uL (ref 1.7–7.7)
Neutrophils Relative %: 63 % (ref 43–77)
PLATELETS: 277 10*3/uL (ref 150–400)
RBC: 3.9 MIL/uL (ref 3.87–5.11)
RDW: 13.3 % (ref 11.5–15.5)
WBC: 6.1 10*3/uL (ref 4.0–10.5)

## 2014-07-15 LAB — RAPID URINE DRUG SCREEN, HOSP PERFORMED
Amphetamines: NOT DETECTED
Barbiturates: NOT DETECTED
Benzodiazepines: NOT DETECTED
Cocaine: NOT DETECTED
OPIATES: POSITIVE — AB
TETRAHYDROCANNABINOL: NOT DETECTED

## 2014-07-15 LAB — LIPASE, BLOOD: Lipase: 17 U/L (ref 11–59)

## 2014-07-15 LAB — URINE MICROSCOPIC-ADD ON

## 2014-07-15 MED ORDER — METRONIDAZOLE 500 MG PO TABS: 500.0000 mg | ORAL_TABLET | Freq: Two times a day (BID) | ORAL | Status: DC

## 2014-07-15 MED ORDER — BISACODYL 10 MG RE SUPP: 10.0000 mg | Freq: Once | RECTAL | Status: DC

## 2014-07-15 NOTE — ED Provider Notes (Signed)
CSN: [id number]     Arrival date & time 07/15/14  0041 History   First MD Initiated Contact with Patient 07/15/14 [phone]     Chief Complaint  Patient presents with  . Abdominal Pain     (Consider location/radiation/quality/duration/timing/severity/associated sxs/prior Treatment) HPI  Patient presents with diffuse lower abdominal pain that started on Sunday, December 13. She denies nausea, vomiting, or diarrhea. She states her last bowel movement was the day before she started having pain. She denies her stools being harder like balls. She states she's never had this pain before. She denies dysuria or frequency but states urinating, walking, and laying flat makes the pain feel like it's pushing or hurting more. She states nothing makes it feel better. She describes the pain as cramping and pressure. She has had a normal appetite. He denies seeing any blood in her bowel movements or her urine.  PCP [facility]  Past Medical History  Diagnosis Date  . Hypertension    Past Surgical History  Procedure Laterality Date  . Cholecystectomy     Family History  Problem Relation Age of Onset  . Hypertension Mother   . Hypertension Father   . Hypertension Other    History  Substance Use Topics  . Smoking status: Never Smoker   . Smokeless tobacco: Not on file  . Alcohol Use: No   employed   OB History    Gravida Para Term Preterm AB TAB SAB Ectopic Multiple Living   3 2 2  1  1         Review of Systems  All other systems reviewed and are negative.     Allergies  Darvocet and Toradol  Home Medications   Prior to Admission medications   Medication Sig Start Date End Date Taking? Authorizing Provider  acetaminophen (TYLENOL) 325 MG tablet Take 650 mg by mouth every 6 (six) hours as needed for pain.    Historical Provider, MD  cephALEXin (KEFLEX) 500 MG capsule Take 1 capsule (500 mg total) by mouth 4 (four) times daily. 05/24/14   Fredia Sorrow, MD    HYDROcodone-acetaminophen (NORCO/VICODIN) 5-325 MG per tablet Take 1-2 tablets by mouth every 6 (six) hours as needed. 05/24/14   Fredia Sorrow, MD  lisinopril-hydrochlorothiazide (PRINZIDE,ZESTORETIC) 10-12.5 MG per tablet Take 1 tablet by mouth at bedtime.     Historical Provider, MD  metroNIDAZOLE (FLAGYL) 500 MG tablet Take 1 tablet (500 mg total) by mouth 2 (two) times daily. 07/15/14   Janice Norrie, MD  oxyCODONE-acetaminophen (PERCOCET) 7.5-325 MG per tablet Take 1 tablet by mouth every 6 (six) hours as needed. pain 05/14/14   Historical Provider, MD  promethazine (PHENERGAN) 25 MG tablet Take 1 tablet (25 mg total) by mouth every 6 (six) hours as needed. 05/24/14   Fredia Sorrow, MD   BP 128/76 mmHg  Pulse 79  Temp(Src) 97.8 F (36.6 C) (Oral)  Resp 16  Ht 5\' 4"  (1.626 m)  Wt 180 lb (81.647 kg)  BMI 30.88 kg/m2  SpO2 100%  Vital signs normal   Physical Exam  Constitutional: She is oriented to person, place, and time. She appears well-developed and well-nourished.  Non-toxic appearance. She does not appear ill. No distress.  HENT:  Head: Normocephalic and atraumatic.  Right Ear: External ear normal.  Left Ear: External ear normal.  Nose: Nose normal. No mucosal edema or rhinorrhea.  Mouth/Throat: Oropharynx is clear and moist and mucous membranes are normal. No dental abscesses or uvula swelling.  Eyes:  Conjunctivae and EOM are normal. Pupils are equal, round, and reactive to light.  Neck: Normal range of motion and full passive range of motion without pain. Neck supple.  Cardiovascular: Normal rate, regular rhythm and normal heart sounds.  Exam reveals no gallop and no friction rub.   No murmur heard. Pulmonary/Chest: Effort normal and breath sounds normal. No respiratory distress. She has no wheezes. She has no rhonchi. She has no rales. She exhibits no tenderness and no crepitus.  Abdominal: Soft. Normal appearance and bowel sounds are normal. She exhibits no distension.  There is tenderness. There is no rebound and no guarding.    Patient has diffuse lower abdominal tenderness.  Musculoskeletal: Normal range of motion. She exhibits no edema or tenderness.  Moves all extremities well.   Neurological: She is alert and oriented to person, place, and time. She has normal strength. No cranial nerve deficit.  Skin: Skin is warm, dry and intact. No rash noted. No erythema. No pallor.  Psychiatric: She has a normal mood and affect. Her speech is normal and behavior is normal. Her mood appears not anxious.  Nursing note and vitals reviewed.   ED Course  Procedures (including critical care time)  Medications  bisacodyl (DULCOLAX) suppository 10 mg (10 mg Rectal Not Given 07/15/14 0440)    review of the [place] shows patient get #120 oxycodone 5/325 monthly, the last was filled November 30. These are prescribed by Dr. Luna Glasgow, orthopedist. When I first asked patient if she's run out of her pain medicine she says yes, she then states that she hasn't. I suspect patient's abdominal pain is from constipation from her chronic narcotic ingestion. Patient was seen on October 24 with abdominal pain. She had a CT scan at that time which did not show any acute findings.   Labs Review Results for orders placed or performed during the hospital encounter of 07/15/14  Comprehensive metabolic panel  Result Value Ref Range   Sodium 138 137 - 147 mEq/L   Potassium 3.3 (L) 3.7 - 5.3 mEq/L   Chloride 99 96 - 112 mEq/L   CO2 30 19 - 32 mEq/L   Glucose, Bld 91 70 - 99 mg/dL   BUN 13 6 - 23 mg/dL   Creatinine, Ser 1.07 0.50 - 1.10 mg/dL   Calcium 9.3 8.4 - 10.5 mg/dL   Total Protein 7.6 6.0 - 8.3 g/dL   Albumin 3.7 3.5 - 5.2 g/dL   AST 14 0 - 37 U/L   ALT 9 0 - 35 U/L   Alkaline Phosphatase 60 39 - 117 U/L   Total Bilirubin <0.2 (L) 0.3 - 1.2 mg/dL   GFR calc non Af Amer 55 (L) >90 mL/min   GFR calc Af Amer 64 (L) >90 mL/min   Anion gap 9 5 - 15  Lipase,  blood  Result Value Ref Range   Lipase 17 11 - 59 U/L  CBC with Differential  Result Value Ref Range   WBC 6.1 4.0 - 10.5 K/uL   RBC 3.90 3.87 - 5.11 MIL/uL   Hemoglobin 10.8 (L) 12.0 - 15.0 g/dL   HCT 34.3 (L) 36.0 - 46.0 %   MCV 87.9 78.0 - 100.0 fL   MCH 27.7 26.0 - 34.0 pg   MCHC 31.5 30.0 - 36.0 g/dL   RDW 13.3 11.5 - 15.5 %   Platelets 277 150 - 400 K/uL   Neutrophils Relative % 63 43 - 77 %   Neutro Abs 3.8 1.7 -  7.7 K/uL   Lymphocytes Relative 23 12 - 46 %   Lymphs Abs 1.4 0.7 - 4.0 K/uL   Monocytes Relative 11 3 - 12 %   Monocytes Absolute 0.7 0.1 - 1.0 K/uL   Eosinophils Relative 3 0 - 5 %   Eosinophils Absolute 0.2 0.0 - 0.7 K/uL   Basophils Relative 0 0 - 1 %   Basophils Absolute 0.0 0.0 - 0.1 K/uL  Urine rapid drug screen (hosp performed)  Result Value Ref Range   Opiates POSITIVE (A) NONE DETECTED   Cocaine NONE DETECTED NONE DETECTED   Benzodiazepines NONE DETECTED NONE DETECTED   Amphetamines NONE DETECTED NONE DETECTED   Tetrahydrocannabinol NONE DETECTED NONE DETECTED   Barbiturates NONE DETECTED NONE DETECTED  Urinalysis, Routine w reflex microscopic  Result Value Ref Range   Color, Urine YELLOW YELLOW   APPearance CLEAR CLEAR   Specific Gravity, Urine 1.015 1.005 - 1.030   pH 7.5 5.0 - 8.0   Glucose, UA NEGATIVE NEGATIVE mg/dL   Hgb urine dipstick NEGATIVE NEGATIVE   Bilirubin Urine NEGATIVE NEGATIVE   Ketones, ur NEGATIVE NEGATIVE mg/dL   Protein, ur NEGATIVE NEGATIVE mg/dL   Urobilinogen, UA 0.2 0.0 - 1.0 mg/dL   Nitrite NEGATIVE NEGATIVE   Leukocytes, UA SMALL (A) NEGATIVE  Urine microscopic-add on  Result Value Ref Range   Squamous Epithelial / LPF FEW (A) RARE   WBC, UA 7-10 <3 WBC/hpf   RBC / HPF 0-2 <3 RBC/hpf   Bacteria, UA FEW (A) RARE   Urine-Other TRICHOMONAS PRESENT    Laboratory interpretation all normal except for trichomoniasis   Imaging Review  Dg Abd Acute W/chest  07/15/2014   CLINICAL DATA:  Lower abdominal pain and  cramping.  EXAM: ACUTE ABDOMEN SERIES (ABDOMEN 2 VIEW & CHEST 1 VIEW)  COMPARISON:  02/20/2013  FINDINGS: Normal heart size and mediastinal contours. No acute infiltrate or edema. No effusion or pneumothorax. No acute osseous findings.  Large volume of formed stool, present in most colonic segments. There is no evidence for small bowel obstruction or perforation. No concerning intra-abdominal mass effect or calcification. Cholecystectomy clips are noted.  IMPRESSION: 1. Findings compatible with constipation. 2. No evidence of bowel obstruction or perforation. 3. Clear chest.   Electronically Signed   By: Jorje Guild M.D.   On: 07/15/2014 04:45    AP CT scan on May 24, 2014 IMPRESSION: 1. No definite acute findings in the abdomen or pelvis to account for the patient's symptoms. 2. There is very mild intrahepatic biliary ductal dilatation, and the common bile duct measures 9 mm in the porta hepatis. Given the patient's age and post cholecystectomy status, these findings are favored to reflect "functional" biliary dilatation, however, clinical correlation and correlation with liver function tests is recommended to exclude evidence of biliary tract obstruction. If there is clinical concern for biliary tract obstruction, further evaluation with MRCP could be performed to exclude the presence of a retained ductal stone or distal ductal structure. 3. Additional incidental findings, as above.   Electronically Signed  By: Vinnie Langton M.D.  On: 05/24/2014 13:14    EKG Interpretation None      MDM   Final diagnoses:  Abdominal pain  Constipation, unspecified constipation type  Trichomoniasis   New Prescriptions   METRONIDAZOLE (FLAGYL) 500 MG TABLET    Take 1 tablet (500 mg total) by mouth 2 (two) times daily.  miralax OTC  Plan discharge  Rolland Porter, MD, Abram Sander  Janice Norrie, MD 07/15/14 [phone]

## 2014-07-15 NOTE — ED Notes (Signed)
Pt c/o lower right abdominal pain since yesterday. Denies N/V/D.

## 2014-07-15 NOTE — Discharge Instructions (Signed)
Get miralax and put one dose or 17 g in 8 ounces of water,  take 1 dose every 30 minutes for 2-3 hours or until you  get good results and then once or twice daily to prevent constipation. Take the antibiotic until gone for the trichomoniasis.    Constipation Constipation is when a person has fewer than three bowel movements a week, has difficulty having a bowel movement, or has stools that are dry, hard, or larger than normal. As people grow older, constipation is more common. If you try to fix constipation with medicines that make you have a bowel movement (laxatives), the problem may get worse. Long-term laxative use may cause the muscles of the colon to become weak. A low-fiber diet, not taking in enough fluids, and taking certain medicines may make constipation worse.  CAUSES   Certain medicines, such as antidepressants, pain medicine, iron supplements, antacids, and water pills.   Certain diseases, such as diabetes, irritable bowel syndrome (IBS), thyroid disease, or depression.   Not drinking enough water.   Not eating enough fiber-rich foods.   Stress or travel.   Lack of physical activity or exercise.   Ignoring the urge to have a bowel movement.   Using laxatives too much.  SIGNS AND SYMPTOMS   Having fewer than three bowel movements a week.   Straining to have a bowel movement.   Having stools that are hard, dry, or larger than normal.   Feeling full or bloated.   Pain in the lower abdomen.   Not feeling relief after having a bowel movement.  DIAGNOSIS  Your health care provider will take a medical history and perform a physical exam. Further testing may be done for severe constipation. Some tests may include:  A barium enema X-ray to examine your rectum, colon, and, sometimes, your small intestine.   A sigmoidoscopy to examine your lower colon.   A colonoscopy to examine your entire colon. TREATMENT  Treatment will depend on the severity of your  constipation and what is causing it. Some dietary treatments include drinking more fluids and eating more fiber-rich foods. Lifestyle treatments may include regular exercise. If these diet and lifestyle recommendations do not help, your health care provider may recommend taking over-the-counter laxative medicines to help you have bowel movements. Prescription medicines may be prescribed if over-the-counter medicines do not work.  HOME CARE INSTRUCTIONS   Eat foods that have a lot of fiber, such as fruits, vegetables, whole grains, and beans.  Limit foods high in fat and processed sugars, such as french fries, hamburgers, cookies, candies, and soda.   A fiber supplement may be added to your diet if you cannot get enough fiber from foods.   Drink enough fluids to keep your urine clear or pale yellow.   Exercise regularly or as directed by your health care provider.   Go to the restroom when you have the urge to go. Do not hold it.   Only take over-the-counter or prescription medicines as directed by your health care provider. Do not take other medicines for constipation without talking to your health care provider first.  [place] IF:   You have bright red blood in your stool.   Your constipation lasts for more than 4 days or gets worse.   You have abdominal or rectal pain.   You have thin, pencil-like stools.   You have unexplained weight loss. MAKE SURE YOU:   Understand these instructions.  Will watch your condition.  Will get help right away if you are not doing well or get worse. Document Released: 04/15/2004 Document Revised: 07/23/2013 Document Reviewed: 04/29/2013 [facility] Patient Information 2015 [place], [place]. This information is not intended to replace advice given to you by your health care provider. Make sure you discuss any questions you have with your health care provider.  Trichomoniasis Trichomoniasis is an infection caused by an  organism called Trichomonas. The infection can affect both women and men. In women, the outer female genitalia and the vagina are affected. In men, the penis is mainly affected, but the prostate and other reproductive organs can also be involved. Trichomoniasis is a sexually transmitted infection (STI) and is most often passed to another person through sexual contact.  RISK FACTORS  Having unprotected sexual intercourse.  Having sexual intercourse with an infected partner. SIGNS AND SYMPTOMS  Symptoms of trichomoniasis in women include:  Abnormal gray-green frothy vaginal discharge.  Itching and irritation of the vagina.  Itching and irritation of the area outside the vagina. Symptoms of trichomoniasis in men include:   Penile discharge with or without pain.  Pain during urination. This results from inflammation of the urethra. DIAGNOSIS  Trichomoniasis may be found during a Pap test or physical exam. Your health care provider may use one of the following methods to help diagnose this infection:  Examining vaginal discharge under a microscope. For men, urethral discharge would be examined.  Testing the pH of the vagina with a test tape.  Using a vaginal swab test that checks for the Trichomonas organism. A test is available that provides results within a few minutes.  Doing a culture test for the organism. This is not usually needed. TREATMENT   You may be given medicine to fight the infection. Women should inform their health care provider if they could be or are pregnant. Some medicines used to treat the infection should not be taken during pregnancy.  Your health care provider may recommend over-the-counter medicines or creams to decrease itching or irritation.  Your sexual partner will need to be treated if infected. HOME CARE INSTRUCTIONS   Take medicines only as directed by your health care provider.  Take over-the-counter medicine for itching or irritation as directed  by your health care provider.  Do not have sexual intercourse while you have the infection.  Women should not douche or wear tampons while they have the infection.  Discuss your infection with your partner. Your partner may have gotten the infection from you, or you may have gotten it from your partner.  Have your sex partner get examined and treated if necessary.  Practice safe, informed, and protected sex.  See your health care provider for other STI testing. SEEK MEDICAL CARE IF:   You still have symptoms after you finish your medicine.  You develop abdominal pain.  You have pain when you urinate.  You have bleeding after sexual intercourse.  You develop a rash.  Your medicine makes you sick or makes you throw up (vomit). MAKE SURE YOU:  Understand these instructions.  Will watch your condition.  Will get help right away if you are not doing well or get worse. Document Released: 01/11/2001 Document Revised: 12/02/2013 Document Reviewed: 04/29/2013 [facility] Patient Information 2015 [place], [place]. This information is not intended to replace advice given to you by your health care provider. Make sure you discuss any questions you have with your health care provider.

## 2014-07-15 NOTE — ED Notes (Signed)
MD at bedside. 

## 2015-01-26 ENCOUNTER — Encounter (HOSPITAL_COMMUNITY): Payer: Self-pay | Admitting: *Deleted

## 2015-01-26 ENCOUNTER — Emergency Department (HOSPITAL_COMMUNITY)
Admission: EM | Admit: 2015-01-26 | Discharge: 2015-01-26 | Disposition: A | Discharge status: Home / Self Care | Payer: BLUE CROSS/BLUE SHIELD | Attending: Emergency Medicine | Admitting: Emergency Medicine

## 2015-01-26 DIAGNOSIS — Z8614 Personal history of Methicillin resistant Staphylococcus aureus infection: Secondary | ICD-10-CM | POA: Insufficient documentation

## 2015-01-26 DIAGNOSIS — I10 Essential (primary) hypertension: Secondary | ICD-10-CM | POA: Diagnosis not present

## 2015-01-26 DIAGNOSIS — L02411 Cutaneous abscess of right axilla: Secondary | ICD-10-CM | POA: Diagnosis not present

## 2015-01-26 DIAGNOSIS — Z79899 Other long term (current) drug therapy: Secondary | ICD-10-CM | POA: Diagnosis not present

## 2015-01-26 LAB — BASIC METABOLIC PANEL
Anion gap: 8 (ref 5–15)
BUN: 14 mg/dL (ref 6–20)
CO2: 27 mmol/L (ref 22–32)
CREATININE: 0.89 mg/dL (ref 0.44–1.00)
Calcium: 9.1 mg/dL (ref 8.9–10.3)
Chloride: 101 mmol/L (ref 101–111)
GFR calc Af Amer: >60 mL/min (ref >60)
GFR calc non Af Amer: >60 mL/min (ref >60)
Glucose, Bld: 97 mg/dL (ref 65–99)
POTASSIUM: 3.1 mmol/L — AB (ref 3.5–5.1)
SODIUM: 136 mmol/L (ref 135–145)

## 2015-01-26 LAB — CBC WITH DIFFERENTIAL/PLATELET
BASOS PCT: 0 % (ref 0–1)
Basophils Absolute: 0 10*3/uL (ref 0.0–0.1)
EOS PCT: 1 % (ref 0–5)
Eosinophils Absolute: 0.2 10*3/uL (ref 0.0–0.7)
HEMATOCRIT: 37.3 % (ref 36.0–46.0)
HEMOGLOBIN: 12.1 g/dL (ref 12.0–15.0)
Lymphocytes Relative: 11 % — ABNORMAL LOW (ref 12–46)
Lymphs Abs: 1.5 10*3/uL (ref 0.7–4.0)
MCH: 28.6 pg (ref 26.0–34.0)
MCHC: 32.4 g/dL (ref 30.0–36.0)
MCV: 88.2 fL (ref 78.0–100.0)
MONO ABS: 1.5 10*3/uL — AB (ref 0.1–1.0)
MONOS PCT: 12 % (ref 3–12)
Neutro Abs: 10.1 10*3/uL — ABNORMAL HIGH (ref 1.7–7.7)
Neutrophils Relative %: 76 % (ref 43–77)
Platelets: 235 10*3/uL (ref 150–400)
RBC: 4.23 MIL/uL (ref 3.87–5.11)
RDW: 14.2 % (ref 11.5–15.5)
WBC: 13.4 10*3/uL — ABNORMAL HIGH (ref 4.0–10.5)

## 2015-01-26 MED ORDER — SULFAMETHOXAZOLE-TRIMETHOPRIM 800-160 MG PO TABS
1.0000 | ORAL_TABLET | Freq: Once | ORAL | Status: AC
  Administered 2015-01-26: 1 via ORAL
  Filled 2015-01-26: qty 1

## 2015-01-26 MED ORDER — SULFAMETHOXAZOLE-TRIMETHOPRIM 800-160 MG PO TABS: 1.0000 | ORAL_TABLET | Freq: Two times a day (BID) | ORAL | Status: AC

## 2015-01-26 MED ORDER — OXYCODONE-ACETAMINOPHEN 5-325 MG PO TABS: 1.0000 | ORAL_TABLET | ORAL | Status: DC | PRN

## 2015-01-26 MED ORDER — OXYCODONE-ACETAMINOPHEN 5-325 MG PO TABS
1.0000 | ORAL_TABLET | Freq: Once | ORAL | Status: AC
Start: 2015-01-26 — End: 2015-01-26
  Administered 2015-01-26: 1 via ORAL
  Filled 2015-01-26: qty 1

## 2015-01-26 NOTE — ED Provider Notes (Signed)
CSN: [id number]     Arrival date & time 01/26/15  1243 History  This chart was scribed for non-physician practitioner, Evalee Jefferson, PA-C working with Milton Ferguson, MD by Tula Nakayama, ED scribe. This patient was seen in room APFT20/APFT20 and the patient's care was started at 2:35 PM   Chief Complaint  Patient presents with  . abcess    The history is provided by the patient. No language interpreter was used.   HPI Comments: Megan Barber is a 64 y.o. female with a history of abscesses and MRSA who presents to the Emergency Department complaining of a constant, gradually worsening area of redness and swelling under her right axilla that started 4 days ago. Pt states subjective fever and chills that started 3 days ago as associated symptoms. Pt has a history of abscesses which required surgical I&D by Dr. Arnoldo Morale 4 years ago. She denies drainage from the wound and nausea as associated symptoms.  No PCP  Past Medical History  Diagnosis Date  . Hypertension   . Skin infection    Past Surgical History  Procedure Laterality Date  . Cholecystectomy    . Abcess drainage    . Tubal ligation     Family History  Problem Relation Age of Onset  . Hypertension Mother   . Hypertension Father   . Hypertension Other    History  Substance Use Topics  . Smoking status: Never Smoker   . Smokeless tobacco: Not on file  . Alcohol Use: No   OB History    Gravida Para Term Preterm AB TAB SAB Ectopic Multiple Living   3 2 2  1  1         Review of Systems  Constitutional: Positive for fever and chills.  Gastrointestinal: Negative for nausea.  Skin: Positive for color change and wound.   Allergies  Darvocet and Toradol  Home Medications   Prior to Admission medications   Medication Sig Start Date End Date Taking? Authorizing Provider  acetaminophen (TYLENOL) 325 MG tablet Take 650 mg by mouth every 6 (six) hours as needed for pain.   Yes Historical Provider, MD   HYDROcodone-acetaminophen (NORCO) 7.5-325 MG per tablet Take 1 tablet by mouth every 4 (four) hours as needed. pain 01/22/15  Yes Historical Provider, MD  lisinopril-hydrochlorothiazide (PRINZIDE,ZESTORETIC) 10-12.5 MG per tablet Take 1 tablet by mouth at bedtime.    Yes Historical Provider, MD  oxyCODONE-acetaminophen (PERCOCET/ROXICET) 5-325 MG per tablet Take 1 tablet by mouth every 4 (four) hours as needed. 01/26/15   Evalee Jefferson, PA-C  sulfamethoxazole-trimethoprim (BACTRIM DS,SEPTRA DS) 800-160 MG per tablet Take 1 tablet by mouth 2 (two) times daily. 01/26/15 02/02/15  Evalee Jefferson, PA-C   BP 148/68 mmHg  Pulse 82  Temp(Src) 98.5 F (36.9 C) (Oral)  Resp 18  Ht 5\' 5"  (1.651 m)  Wt 200 lb (90.719 kg)  BMI 33.28 kg/m2  SpO2 100% Physical Exam  Constitutional: She appears well-developed and well-nourished. No distress.  HENT:  Head: Normocephalic and atraumatic.  Eyes: Conjunctivae and EOM are normal.  Neck: Neck supple. No tracheal deviation present.  Cardiovascular: Normal rate.   Pulmonary/Chest: Effort normal. No respiratory distress.  Skin: Skin is warm and dry. There is erythema.  16 cm area of induration with surrounding erythema under right axilla; erythema spreads to lateral right upper chest  Psychiatric: She has a normal mood and affect. Her behavior is normal.  Nursing note and vitals reviewed.   ED Course  Procedures  DIAGNOSTIC STUDIES: Oxygen Saturation is 100% on RA, normal by my interpretation.    COORDINATION OF CARE: 2:42 PM Discussed treatment plan with pt which includes lab work and [place] of right axilla. Pt agreed to plan.  2:46 PM  Bedside US shows multiple swollen lymph nodes; evidence of nodularity but no obvious pus pocket  2:52 PM Discussed [place] results. Will consult with attending provider, Dr. Dewayne Hatch. Pt agreed to plan.  Labs Review Labs Reviewed  CBC WITH DIFFERENTIAL/PLATELET - Abnormal; Notable for the following:    WBC 13.4 (*)    Neutro Abs  10.1 (*)    Lymphocytes Relative 11 (*)    Monocytes Absolute 1.5 (*)    All other components within normal limits  BASIC METABOLIC PANEL - Abnormal; Notable for the following:    Potassium 3.1 (*)    All other components within normal limits    Imaging Review No results found.   EKG Interpretation None      MDM   Final diagnoses:  Abscess of axilla, right    Pt was also seen by Dr. Roderic Palau during this visit.  Pt does not tolerate gentle pressure and exam of the infected site secondary to pain. No obvious drainable pocket of pus at this time.  Felt with the size of this infection she may benefit from general surgery management of this infection.   Discussed with Dr. Arnoldo Morale who will see pt as outpatient, she is to call for appt.time in 3 days.  In the interim, advised to call Dr. Arnoldo Morale office sooner or return here if sx worsen.  She was placed on bactrim, oxycodone, advised hot compresses to site.   The patient appears reasonably screened and/or stabilized for discharge and I doubt any other medical condition or other [facility] requiring further screening, evaluation, or treatment in the ED at this time prior to discharge.   I personally performed the services described in this documentation, which was scribed in my presence. The recorded information has been reviewed and is accurate.   Evalee Jefferson, PA-C 01/28/15 2014  Milton Ferguson, MD 01/30/15 [phone]

## 2015-01-26 NOTE — ED Notes (Signed)
R axilla redness, swelling and extreme tenderness x 4 days.  Hx of same in past w/surgical excision.

## 2015-01-26 NOTE — Discharge Instructions (Signed)

## 2015-01-29 ENCOUNTER — Encounter (HOSPITAL_COMMUNITY)
Admission: RE | Admit: 2015-01-29 | Discharge: 2015-01-29 | Disposition: A | Discharge status: Home / Self Care | Payer: BLUE CROSS/BLUE SHIELD | Source: Ambulatory Visit | Attending: General Surgery | Admitting: General Surgery

## 2015-01-29 ENCOUNTER — Encounter (HOSPITAL_COMMUNITY): Payer: Self-pay

## 2015-01-29 ENCOUNTER — Other Ambulatory Visit: Payer: Self-pay

## 2015-01-29 DIAGNOSIS — L02411 Cutaneous abscess of right axilla: Secondary | ICD-10-CM | POA: Diagnosis present

## 2015-01-29 DIAGNOSIS — F40240 Claustrophobia: Secondary | ICD-10-CM | POA: Diagnosis not present

## 2015-01-29 DIAGNOSIS — I10 Essential (primary) hypertension: Secondary | ICD-10-CM | POA: Diagnosis not present

## 2015-01-29 DIAGNOSIS — Z79899 Other long term (current) drug therapy: Secondary | ICD-10-CM | POA: Diagnosis not present

## 2015-01-29 NOTE — Pre-Procedure Instructions (Signed)
Patient given information to sign up for my chart at home. 

## 2015-01-29 NOTE — Patient Instructions (Signed)
Chosen Garron Hard  01/29/2015     @PREFPERIOPPHARMACY @   Your procedure is scheduled on  01/30/2015  Report to [facility] at  46  A.M.  Call this number if you have problems the morning of surgery:  [phone]   Remember:  Do not eat food or drink liquids after midnight.  Take these medicines the morning of surgery with A SIP OF WATER  norco (or oxycodone), lisinopril   Do not wear jewelry, make-up or nail polish.  Do not wear lotions, powders, or perfumes.    Do not shave 48 hours prior to surgery.  Men may shave face and neck.  Do not bring valuables to the hospital.  [facility] is not responsible for any belongings or valuables.  Contacts, dentures or bridgework may not be worn into surgery.  Leave your suitcase in the car.  After surgery it may be brought to your room.  For patients admitted to the hospital, discharge time will be determined by your treatment team.  Patients discharged the day of surgery will not be allowed to drive home.   Name and phone number of your driver:   family Special instructions:  none  Please read over the following fact sheets that you were given. Pain Booklet, Coughing and Deep Breathing, Surgical Site Infection Prevention, Anesthesia Post-op Instructions and Care and Recovery After Surgery      Incision and Drainage Incision and drainage is a procedure in which a sac-like structure (cystic structure) is opened and drained. The area to be drained usually contains material such as pus, fluid, or blood.  LET YOUR CAREGIVER KNOW ABOUT:   Allergies to medicine.  Medicines taken, including vitamins, herbs, eyedrops, over-the-counter medicines, and creams.  Use of steroids (by mouth or creams).  Previous problems with anesthetics or numbing medicines.  History of bleeding problems or blood clots.  Previous surgery.  Other health problems, including diabetes and kidney problems.  Possibility of pregnancy, if this  applies. RISKS AND COMPLICATIONS  Pain.  Bleeding.  Scarring.  Infection. BEFORE THE PROCEDURE  You may need to have an ultrasound or other imaging tests to see how large or deep your cystic structure is. Blood tests may also be used to determine if you have an infection or how severe the infection is. You may need to have a tetanus shot. PROCEDURE  The affected area is cleaned with a cleaning fluid. The cyst area will then be numbed with a medicine (local anesthetic). A small incision will be made in the cystic structure. A syringe or catheter may be used to drain the contents of the cystic structure, or the contents may be squeezed out. The area will then be flushed with a cleansing solution. After cleansing the area, it is often gently packed with a gauze or another wound dressing. Once it is packed, it will be covered with gauze and tape or some other type of wound dressing. AFTER THE PROCEDURE   Often, you will be allowed to go home right after the procedure.  You may be given antibiotic medicine to prevent or heal an infection.  If the area was packed with gauze or some other wound dressing, you will likely need to come back in 1 to 2 days to get it removed.  The area should heal in about 14 days. Document Released: 01/11/2001 Document Revised: 01/17/2012 Document Reviewed: 09/12/2011 [facility] Patient Information 2015 [place], [place]. This information is not intended to replace advice given  to you by your health care provider. Make sure you discuss any questions you have with your health care provider. PATIENT INSTRUCTIONS POST-ANESTHESIA  IMMEDIATELY FOLLOWING SURGERY:  Do not drive or operate machinery for the first twenty four hours after surgery.  Do not make any important decisions for twenty four hours after surgery or while taking narcotic pain medications or sedatives.  If you develop intractable nausea and vomiting or a severe headache please notify your doctor  immediately.  FOLLOW-UP:  Please make an appointment with your surgeon as instructed. You do not need to follow up with anesthesia unless specifically instructed to do so.  WOUND CARE INSTRUCTIONS (if applicable):  Keep a dry clean dressing on the anesthesia/puncture wound site if there is drainage.  Once the wound has quit draining you may leave it open to air.  Generally you should leave the bandage intact for twenty four hours unless there is drainage.  If the epidural site drains for more than 36-48 hours please call the anesthesia department.  QUESTIONS?:  Please feel free to call your physician or the hospital operator if you have any questions, and they will be happy to assist you.

## 2015-01-29 NOTE — H&P (Signed)
  NTS SOAP Note  Vital Signs:  Vitals as of: 09/29/6008: Systolic 932: Diastolic 89: Heart Rate 355: Temp 98.99F: Height 60ft 5in: Weight 193Lbs 0 Ounces: Pain Level 10: BMI 32.12  BMI : 32.12 kg/m2  Subjective: This 64 year old female presents for of a right axillary abscess.  Has been present for one week.  Was seen in ER.  Started on antibiotic.  No drainage noted.  Review of Symptoms:  Constitutional:unremarkable   Head:unremarkable Eyes:unremarkable   Nose/Mouth/Throat:unremarkable Cardiovascular:  unremarkable Respiratory:unremarkable Gastrointestinal:  unremarkable   Genitourinary:unremarkable   Musculoskeletal:unremarkable Hematolgic/Lymphatic:unremarkable   Allergic/Immunologic:unremarkable   Past Medical History:  Reviewed  Past Medical History  Surgical History: i and d of groin abscess 2008 Medical Problems: HTN Allergies: nkda Medications: a bp pill   Social History:Reviewed  Social History  Preferred Language: English Race:  Black or African American Ethnicity: Not Hispanic / Latino Age: 13 year Marital Status:  S Alcohol: no   Smoking Status: Never smoker reviewed on 01/29/2015 Functional Status reviewed on 01/29/2015 ------------------------------------------------ Bathing: Normal Cooking: Normal Dressing: Normal Driving: Normal Eating: Normal Managing Meds: Normal Oral Care: Normal Shopping: Normal Toileting: Normal Transferring: Normal Walking: Normal Cognitive Status reviewed on 01/29/2015 ------------------------------------------------ Attention: Normal Decision Making: Normal Language: Normal Memory: Normal Motor: Normal Perception: Normal Problem Solving: Normal Visual and Spatial: Normal   Family History:Reviewed  Family Health History Family History is Unknown    Objective Information: General:Well appearing, well nourished in no distress. Abscess present with erythema, tenderness right  axilla. Heart:RRR, no murmur Lungs:  CTA bilaterally, no wheezes, rhonchi, rales.  Breathing unlabored.  Assessment:Abscess, right axilla  Diagnoses: 682.8  L02.91 Abscess (Cutaneous abscess, unspecified)  Procedures: 73220 - OFFICE OUTPATIENT NEW 20 MINUTES    Plan:  Scheduled for incision and drainage of right axillary abcess on 01/30/15.   Patient Education:Alternative treatments to surgery were discussed with patient (and family).  Risks and benefits  of procedure were fully explained to the patient (and family) who gave informed consent. Patient/family questions were addressed.  Follow-up:Pending Surgery  Kdur prescribed for K 3.1.  1meq po tid x three days.

## 2015-01-30 ENCOUNTER — Encounter (HOSPITAL_COMMUNITY): Payer: Self-pay | Admitting: *Deleted

## 2015-01-30 ENCOUNTER — Ambulatory Visit (HOSPITAL_COMMUNITY): Payer: BLUE CROSS/BLUE SHIELD | Admitting: Anesthesiology

## 2015-01-30 ENCOUNTER — Ambulatory Visit (HOSPITAL_COMMUNITY)
Admission: RE | Admit: 2015-01-30 | Discharge: 2015-01-30 | Disposition: A | Discharge status: Home / Self Care | Payer: BLUE CROSS/BLUE SHIELD | Source: Ambulatory Visit | Attending: General Surgery | Admitting: General Surgery

## 2015-01-30 ENCOUNTER — Encounter (HOSPITAL_COMMUNITY)
Admission: RE | Disposition: A | Discharge status: Home / Self Care | Payer: Self-pay | Source: Ambulatory Visit | Attending: General Surgery

## 2015-01-30 DIAGNOSIS — Z79899 Other long term (current) drug therapy: Secondary | ICD-10-CM | POA: Insufficient documentation

## 2015-01-30 DIAGNOSIS — I10 Essential (primary) hypertension: Secondary | ICD-10-CM | POA: Insufficient documentation

## 2015-01-30 DIAGNOSIS — L02411 Cutaneous abscess of right axilla: Secondary | ICD-10-CM | POA: Diagnosis not present

## 2015-01-30 DIAGNOSIS — F40240 Claustrophobia: Secondary | ICD-10-CM | POA: Insufficient documentation

## 2015-01-30 HISTORY — PX: INCISION AND DRAINAGE ABSCESS: SHX5864

## 2015-01-30 LAB — POCT I-STAT 4, (NA,K, GLUC, HGB,HCT)
GLUCOSE: 111 mg/dL — AB (ref 65–99)
HCT: 43 % (ref 36.0–46.0)
HEMOGLOBIN: 14.6 g/dL (ref 12.0–15.0)
POTASSIUM: 3.5 mmol/L (ref 3.5–5.1)
Sodium: 140 mmol/L (ref 135–145)

## 2015-01-30 SURGERY — INCISION AND DRAINAGE, ABSCESS
Anesthesia: General | Laterality: Right

## 2015-01-30 MED ORDER — MIDAZOLAM HCL 2 MG/2ML IJ SOLN
INTRAMUSCULAR | Status: AC
  Filled 2015-01-30: qty 2

## 2015-01-30 MED ORDER — VANCOMYCIN HCL IN DEXTROSE 1-5 GM/200ML-% IV SOLN
1000.0000 mg | INTRAVENOUS | Status: AC
  Administered 2015-01-30: 1000 mg via INTRAVENOUS

## 2015-01-30 MED ORDER — FENTANYL CITRATE (PF) 100 MCG/2ML IJ SOLN
INTRAMUSCULAR | Status: AC
  Filled 2015-01-30: qty 2

## 2015-01-30 MED ORDER — PROPOFOL 10 MG/ML IV BOLUS
INTRAVENOUS | Status: DC | PRN
  Administered 2015-01-30: 150 mg via INTRAVENOUS

## 2015-01-30 MED ORDER — LIDOCAINE HCL 1 % IJ SOLN
INTRAMUSCULAR | Status: DC | PRN
  Administered 2015-01-30: 30 mg via INTRADERMAL

## 2015-01-30 MED ORDER — FENTANYL CITRATE (PF) 100 MCG/2ML IJ SOLN
25.0000 ug | INTRAMUSCULAR | Status: DC | PRN
  Administered 2015-01-30 (×2): 50 ug via INTRAVENOUS
  Filled 2015-01-30: qty 2

## 2015-01-30 MED ORDER — BUPIVACAINE HCL (PF) 0.5 % IJ SOLN
INTRAMUSCULAR | Status: AC
  Filled 2015-01-30: qty 30

## 2015-01-30 MED ORDER — SODIUM CHLORIDE 0.9 % IR SOLN
Status: DC | PRN
  Administered 2015-01-30: 1000 mL

## 2015-01-30 MED ORDER — ONDANSETRON HCL 4 MG/2ML IJ SOLN: 4.0000 mg | Freq: Once | INTRAMUSCULAR | Status: DC | PRN

## 2015-01-30 MED ORDER — OXYCODONE-ACETAMINOPHEN 7.5-325 MG PO TABS: 1.0000 | ORAL_TABLET | ORAL | Status: DC | PRN

## 2015-01-30 MED ORDER — PROPOFOL 10 MG/ML IV BOLUS
INTRAVENOUS | Status: AC
  Filled 2015-01-30: qty 20

## 2015-01-30 MED ORDER — FENTANYL CITRATE (PF) 100 MCG/2ML IJ SOLN
25.0000 ug | INTRAMUSCULAR | Status: AC
  Administered 2015-01-30 (×2): 25 ug via INTRAVENOUS

## 2015-01-30 MED ORDER — MIDAZOLAM HCL 5 MG/5ML IJ SOLN
INTRAMUSCULAR | Status: DC | PRN
  Administered 2015-01-30: 2 mg via INTRAVENOUS

## 2015-01-30 MED ORDER — VANCOMYCIN HCL IN DEXTROSE 1-5 GM/200ML-% IV SOLN
INTRAVENOUS | Status: AC
  Filled 2015-01-30: qty 200

## 2015-01-30 MED ORDER — MIDAZOLAM HCL 2 MG/2ML IJ SOLN
1.0000 mg | INTRAMUSCULAR | Status: DC | PRN
  Administered 2015-01-30 (×3): 2 mg via INTRAVENOUS
  Filled 2015-01-30 (×2): qty 2

## 2015-01-30 MED ORDER — FENTANYL CITRATE (PF) 100 MCG/2ML IJ SOLN
INTRAMUSCULAR | Status: DC | PRN
  Administered 2015-01-30 (×3): 50 ug via INTRAVENOUS
  Administered 2015-01-30: 100 ug via INTRAVENOUS
  Administered 2015-01-30: 50 ug via INTRAVENOUS

## 2015-01-30 MED ORDER — LACTATED RINGERS IV SOLN
INTRAVENOUS | Status: DC
  Administered 2015-01-30 (×2): 1000 mL via INTRAVENOUS

## 2015-01-30 MED ORDER — ONDANSETRON HCL 4 MG/2ML IJ SOLN
4.0000 mg | Freq: Once | INTRAMUSCULAR | Status: AC
  Administered 2015-01-30: 4 mg via INTRAVENOUS

## 2015-01-30 MED ORDER — CHLORHEXIDINE GLUCONATE 4 % EX LIQD: 1.0000 "application " | Freq: Once | CUTANEOUS | Status: DC

## 2015-01-30 MED ORDER — ONDANSETRON HCL 4 MG/2ML IJ SOLN
INTRAMUSCULAR | Status: AC
  Filled 2015-01-30: qty 2

## 2015-01-30 SURGICAL SUPPLY — 22 items
BAG HAMPER (MISCELLANEOUS) ×3 IMPLANT
CLOTH BEACON ORANGE TIMEOUT ST (SAFETY) ×3 IMPLANT
COVER LIGHT HANDLE STERIS (MISCELLANEOUS) ×6 IMPLANT
ELECT REM PT RETURN 9FT ADLT (ELECTROSURGICAL) ×3
ELECTRODE REM PT RTRN 9FT ADLT (ELECTROSURGICAL) ×1 IMPLANT
GAUZE PACKING IODOFORM 2 (PACKING) ×3 IMPLANT
GAUZE SPONGE 4X4 12PLY STRL (GAUZE/BANDAGES/DRESSINGS) IMPLANT
GLOVE BIOGEL M STRL SZ7.5 (GLOVE) ×3 IMPLANT
GLOVE BIOGEL PI IND STRL 7.0 (GLOVE) ×4 IMPLANT
GLOVE BIOGEL PI INDICATOR 7.0 (GLOVE) ×8
GLOVE ECLIPSE 6.5 STRL STRAW (GLOVE) ×3 IMPLANT
GLOVE SURG SS PI 7.5 STRL IVOR (GLOVE) ×3 IMPLANT
GOWN STRL REUS W/TWL LRG LVL3 (GOWN DISPOSABLE) ×9 IMPLANT
KIT ROOM TURNOVER APOR (KITS) ×3 IMPLANT
MANIFOLD NEPTUNE II (INSTRUMENTS) ×3 IMPLANT
NS IRRIG 1000ML POUR BTL (IV SOLUTION) ×3 IMPLANT
PACK MINOR (CUSTOM PROCEDURE TRAY) ×3 IMPLANT
PAD ARMBOARD 7.5X6 YLW CONV (MISCELLANEOUS) ×3 IMPLANT
SET BASIN LINEN APH (SET/KITS/TRAYS/PACK) ×3 IMPLANT
SWAB CULTURE LIQ STUART DBL (MISCELLANEOUS) ×3 IMPLANT
SYR BULB IRRIGATION 50ML (SYRINGE) ×3 IMPLANT
TAPE MEDIFIX FOAM 3 (GAUZE/BANDAGES/DRESSINGS) ×3 IMPLANT

## 2015-01-30 NOTE — Anesthesia Postprocedure Evaluation (Signed)
  Anesthesia Post-op Note  Patient: Megan Barber  Procedure(s) Performed: Procedure(s): INCISION AND DRAINAGE RIGHT AXILLARY ABSCESS (Right)  Patient Location: PACU  Anesthesia Type:General  Level of Consciousness: awake, alert , oriented and patient cooperative  Airway and Oxygen Therapy: Patient Spontanous Breathing  Post-op Pain: 5 /10, moderate  Post-op Assessment: Post-op Vital signs reviewed, Patient's Cardiovascular Status Stable, Respiratory Function Stable, Patent Airway, No signs of Nausea or vomiting and Pain level controlled              Post-op Vital Signs: Reviewed and stable  Last Vitals:  Filed Vitals:   01/30/15 0820  BP: 158/79  Pulse:   Temp: 38 C  Resp:     Complications: No apparent anesthesia complications

## 2015-01-30 NOTE — Interval H&P Note (Signed)
History and Physical Interval Note:  01/30/2015 7:24 AM  Hornbeck  has presented today for surgery, with the diagnosis of right axilla abscess  The various methods of treatment have been discussed with the patient and family. After consideration of risks, benefits and other options for treatment, the patient has consented to  Procedure(s): INCISION AND DRAINAGE RIGHT AXILLARY ABSCESS (Right) as a surgical intervention .  The patient's history has been reviewed, patient examined, no change in status, stable for surgery.  I have reviewed the patient's chart and labs.  Questions were answered to the patient's satisfaction.     Aviva Signs A

## 2015-01-30 NOTE — Transfer of Care (Signed)
Immediate Anesthesia Transfer of Care Note  Patient: Megan Barber  Procedure(s) Performed: Procedure(s): INCISION AND DRAINAGE RIGHT AXILLARY ABSCESS (Right)  Patient Location: PACU  Anesthesia Type:General  Level of Consciousness: awake and patient cooperative  Airway & Oxygen Therapy: Patient Spontanous Breathing and Patient connected to face mask oxygen  Post-op Assessment: Report given to RN, Post -op Vital signs reviewed and stable and Patient moving all extremities  Post vital signs: Reviewed and stable  Last Vitals:  Filed Vitals:   01/30/15 0730  BP: 144/80  Pulse:   Temp:   Resp: 18    Complications: No apparent anesthesia complications

## 2015-01-30 NOTE — Anesthesia Preprocedure Evaluation (Signed)
Anesthesia Evaluation  Patient identified by MRN, date of birth, ID band Patient awake    Reviewed: Allergy & Precautions, NPO status , Patient's Chart, lab work & pertinent test results  Airway Mallampati: II  TM Distance: >3 FB     Dental  (+) Edentulous Upper, Edentulous Lower   Pulmonary neg pulmonary ROS,  breath sounds clear to auscultation        Cardiovascular hypertension, Pt. on medications Rhythm:Regular Rate:Normal     Neuro/Psych Claustrophobia - doesn't like O2 mask   GI/Hepatic negative GI ROS,   Endo/Other    Renal/GU      Musculoskeletal   Abdominal   Peds  Hematology   Anesthesia Other Findings   Reproductive/Obstetrics                             Anesthesia Physical Anesthesia Plan  ASA: II  Anesthesia Plan: General   Post-op Pain Management:    Induction: Intravenous  Airway Management Planned: LMA  Additional Equipment:   Intra-op Plan:   Post-operative Plan: Extubation in OR  Informed Consent: I have reviewed the patients History and Physical, chart, labs and discussed the procedure including the risks, benefits and alternatives for the proposed anesthesia with the patient or authorized representative who has indicated his/her understanding and acceptance.     Plan Discussed with:   Anesthesia Plan Comments:         Anesthesia Quick Evaluation

## 2015-01-30 NOTE — Op Note (Signed)
Patient:  Megan Barber  DOB:  Apr 06, 1951  MRN:  [id number]   Preop Diagnosis:  Abscess, right axilla  Postop Diagnosis:  Same  Procedure:  Incision and drainage of right axillary abscess  Surgeon:  Aviva Signs, M.D.  Anes:  Gen.  Indications:  Patient is a 64 year old black female who presents with a right axillary abscess. The risks and benefits of the procedure including bleeding and infection were fully explained to the patient, who gave informed consent.  Procedure note:  The patient was placed the supine position. After general anesthesia was administered, the right axilla was prepped and draped using usual sterile technique with Betadine. Surgical site confirmation was performed.  The patient had fluctulant mass in the right axilla which was greater than 6 cm in diameter. An incision was made and purulent fluid was found. Aerobic cultures were taken and sent to microbiology. The abscess extended into the soft tissue. All was evacuated and the wound was irrigated with normal saline. Any bleeding was controlled using Bovie electrocautery. The wound was packed open with iodoform nugauze. A dry sterile dressing was then applied.  All tape and needle counts were correct the end of the procedure. The patient was awakened and transferred to PACU in stable condition.  Complications:  None  EBL:  Minimal  Specimen:  Aerobic culture of right axillary abscess

## 2015-01-30 NOTE — Discharge Instructions (Signed)

## 2015-01-30 NOTE — Addendum Note (Signed)
Addendum  created 01/30/15 1043 by Vista Deck, CRNA   Modules edited: Charges VN

## 2015-01-30 NOTE — Anesthesia Procedure Notes (Signed)
Procedure Name: LMA Insertion Date/Time: 01/30/2015 7:39 AM Performed by: Charmaine Downs Pre-anesthesia Checklist: Patient identified, Emergency Drugs available, Patient being monitored and Suction available Patient Re-evaluated:Patient Re-evaluated prior to inductionOxygen Delivery Method: Circle system utilized Preoxygenation: Pre-oxygenation with 100% oxygen Ventilation: Mask ventilation without difficulty LMA Size: 4.0 Grade View: Grade II Tube type: Oral Number of attempts: 1 Placement Confirmation: breath sounds checked- equal and bilateral and positive ETCO2 Tube secured with: Tape Dental Injury: Teeth and Oropharynx as per pre-operative assessment

## 2015-02-02 LAB — CULTURE, ROUTINE-ABSCESS

## 2015-02-03 ENCOUNTER — Encounter (HOSPITAL_COMMUNITY): Payer: Self-pay | Admitting: General Surgery

## 2015-09-16 ENCOUNTER — Ambulatory Visit (INDEPENDENT_AMBULATORY_CARE_PROVIDER_SITE_OTHER): Payer: BLUE CROSS/BLUE SHIELD | Admitting: Orthopaedic Surgery

## 2015-09-16 ENCOUNTER — Encounter: Payer: Self-pay | Admitting: Orthopaedic Surgery

## 2015-09-16 VITALS — BP 120/69 | HR 75 | Temp 97.5°F | Resp 16 | Ht 64.1 in | Wt 200.0 lb

## 2015-09-16 DIAGNOSIS — G8929 Other chronic pain: Secondary | ICD-10-CM | POA: Insufficient documentation

## 2015-09-16 DIAGNOSIS — M25562 Pain in left knee: Secondary | ICD-10-CM | POA: Diagnosis not present

## 2015-09-16 MED ORDER — HYDROCODONE-ACETAMINOPHEN 7.5-325 MG PO TABS: 1.0000 | ORAL_TABLET | ORAL | Status: DC | PRN

## 2015-09-16 NOTE — Patient Instructions (Signed)
Continue medicines. Call if any problem.

## 2015-09-16 NOTE — Progress Notes (Signed)
Patient Megan Barber:[id number] C Megan Barber, female DOB:[birth year], 65 y.o. [id number]  Chief Complaint  Patient presents with  . Follow-up    follow up left knee "hurts today"    HPI  [name] is a 65 y.o. female with long history of left knee pain.  She has no giving way, no locking, no redness. She has swelling and popping.  Cold weather makes it worse.  NSAIDs help as does rest and not being up on it.  She has no new trauma.  Knee Pain  There was no injury mechanism. The pain is present in the left knee. The quality of the pain is described as aching. The pain is at a severity of 3/10. The pain is mild. The pain has been fluctuating since onset. Associated symptoms include a loss of motion. Pertinent negatives include no muscle weakness or tingling. The symptoms are aggravated by weight bearing. She has tried acetaminophen, heat, NSAIDs and rest for the symptoms. The treatment provided moderate relief.    Body mass index is 34.23 kg/(m^2).  Review of Systems  Patient does not have Diabetes Mellitus. Patient has hypertension. Patient does not have COPD or shortness of breath. Patient does not have BMI > 35. Patient does not have current smoking history.  Review of Systems  Cardiovascular:       Hypertension  Musculoskeletal: Positive for joint swelling (left knee chronic).  Neurological: Negative for tingling.  All other systems reviewed and are negative.   Past Medical History  Diagnosis Date  . Hypertension   . Skin infection     Past Surgical History  Procedure Laterality Date  . Cholecystectomy    . Abcess drainage      suprapubic  . Tubal ligation    . Incision and drainage abscess Right 01/30/2015    Procedure: INCISION AND DRAINAGE RIGHT AXILLARY ABSCESS;  Surgeon: Aviva Signs, MD;  Location: [facility];  Service: General;  Laterality: Right;    Family History  Problem Relation Age of Onset  . Hypertension Mother   . Hypertension Father   . Hypertension Other      Social History Social History  Substance Use Topics  . Smoking status: Never Smoker   . Smokeless tobacco: None  . Alcohol Use: No    Allergies  Allergen Reactions  . Darvocet [Propoxyphene N-Acetaminophen] Nausea And Vomiting  . Toradol [Ketorolac Tromethamine] Nausea And Vomiting    Current Outpatient Prescriptions  Medication Sig Dispense Refill  . acetaminophen (TYLENOL) 325 MG tablet Take 650 mg by mouth every 6 (six) hours as needed for pain.    Marland Kitchen lisinopril-hydrochlorothiazide (PRINZIDE,ZESTORETIC) 10-12.5 MG per tablet Take 1 tablet by mouth at bedtime.     Marland Kitchen HYDROcodone-acetaminophen (NORCO) 7.5-325 MG tablet Take 1 tablet by mouth every 4 (four) hours as needed for moderate pain (Must last 30 days.  Do not drive or operate machinery while taking this medicine.). 120 tablet 0   No current facility-administered medications for this visit.     Physical Exam  Blood pressure 120/69, pulse 75, temperature 97.5 F (36.4 C), resp. rate 16, height 5' 4.1" (1.628 m), weight 200 lb (90.719 kg).  Constitutional: overall normal hygiene, normal nutrition, well developed, normal grooming, normal body habitus. Assistive device:none  Musculoskeletal: gait and station Limp to the left, muscle tone and strength are normal, no tremors or atrophy is present.  .  Neurological: coordination overall normal.  Deep tendon reflex/nerve stretch intact.  Sensation normal.  Cranial nerves II-XII intact.   Skin:normal  overall no scars, lesions, ulcers or rash es. No psoriasis.  Psychiatric: Alert and oriented x 3.  Recent memory intact, remote memory unclear.  Normal mood and affect. Well groomed.  Good eye contact.  Cardiovascular: overall no swelling, no varicosities, no edema bilaterally, normal temperatures of the legs and arms, no clubbing, cyanosis and good capillary refill.  Lymphatic: palpation is normal.  The left lower extremity is examined:  Inspection:  Thigh:  Non-tender  and no defects  Knee has swelling 2+ effusion.                        Joint tenderness is present                        Patient is tender over the medial joint line  Lower Leg:  Has normal appearance and no tenderness or defects  Ankle:  Non-tender and no defects  Foot:  Non-tender and no defects Range of Motion:  Knee:  Range of motion is: 0 to 105 with crepitus                        Crepitus is  present  Ankle:  Range of motion is normal. Strength and Tone:  The left lower extremity has normal strength and tone. Stability:  Knee:  The knee is stable.  Ankle:  The ankle is stable.   Additional services performed: Her blood pressure is well maintained.  She is to continue her walking program as best as she can do with the knee pain.  Her health is otherwise good.  PLAN Call if any problems.  Precautions discussed.  Continue current medications.   Return to clinic 2 months

## 2015-10-13 ENCOUNTER — Telehealth: Payer: Self-pay | Admitting: Orthopedic Surgery

## 2015-10-13 NOTE — Telephone Encounter (Signed)
Hydrocodone-acetaminophen (Norco)  7.5-325 mgs.  Qty 120 Sig: Take 1 tablet by mouth every 4 (four) hours as needed for moderate pain (Must last 30 days. Do not drive or operate machinery while taking this medicine.).

## 2015-10-14 ENCOUNTER — Other Ambulatory Visit: Payer: Self-pay | Admitting: *Deleted

## 2015-10-14 MED ORDER — HYDROCODONE-ACETAMINOPHEN 7.5-325 MG PO TABS: 1.0000 | ORAL_TABLET | ORAL | Status: DC | PRN

## 2015-11-11 ENCOUNTER — Ambulatory Visit (INDEPENDENT_AMBULATORY_CARE_PROVIDER_SITE_OTHER): Payer: BLUE CROSS/BLUE SHIELD | Admitting: Orthopaedic Surgery

## 2015-11-11 ENCOUNTER — Encounter: Payer: Self-pay | Admitting: Orthopaedic Surgery

## 2015-11-11 ENCOUNTER — Ambulatory Visit (INDEPENDENT_AMBULATORY_CARE_PROVIDER_SITE_OTHER): Payer: BLUE CROSS/BLUE SHIELD

## 2015-11-11 VITALS — BP 123/79 | HR 71 | Temp 97.2°F | Resp 16 | Ht 64.1 in | Wt 194.0 lb

## 2015-11-11 DIAGNOSIS — M25562 Pain in left knee: Secondary | ICD-10-CM

## 2015-11-11 MED ORDER — HYDROCODONE-ACETAMINOPHEN 7.5-325 MG PO TABS: 1.0000 | ORAL_TABLET | ORAL | Status: DC | PRN

## 2015-11-11 NOTE — Progress Notes (Signed)
CC:  I have pain of my left knee. I would like an injection.  The patient has chronic pain of the left knee.  There is no recent trauma.  There is no redness.  Injections in the past have helped.  The knee has no redness, has an effusion and crepitus present.  ROM of the knee is 0-105.  Impression:  Chronic knee pain left.  Return: one month  PROCEDURE NOTE:  The patient requests injections of the left knee , verbal consent was obtained.  The left knee was prepped appropriately after time out was performed.   Sterile technique was observed and injection of 1 cc of Depo-Medrol 40 mg with several cc's of plain xylocaine. Anesthesia was provided by ethyl chloride and a 20-gauge needle was used to inject the knee area. The injection was tolerated well.  A band aid dressing was applied.  The patient was advised to apply ice later today and tomorrow to the injection sight as needed.

## 2015-12-09 ENCOUNTER — Ambulatory Visit (INDEPENDENT_AMBULATORY_CARE_PROVIDER_SITE_OTHER): Payer: BLUE CROSS/BLUE SHIELD | Admitting: Orthopaedic Surgery

## 2015-12-09 ENCOUNTER — Encounter: Payer: Self-pay | Admitting: Orthopaedic Surgery

## 2015-12-09 VITALS — BP 124/77 | HR 81 | Temp 97.3°F | Ht 64.0 in | Wt 194.0 lb

## 2015-12-09 DIAGNOSIS — M25562 Pain in left knee: Secondary | ICD-10-CM

## 2015-12-09 DIAGNOSIS — I10 Essential (primary) hypertension: Secondary | ICD-10-CM | POA: Diagnosis not present

## 2015-12-09 MED ORDER — HYDROCODONE-ACETAMINOPHEN 7.5-325 MG PO TABS: 1.0000 | ORAL_TABLET | ORAL | Status: DC | PRN

## 2015-12-09 NOTE — Patient Instructions (Signed)
Get MRI of the left knee.

## 2015-12-09 NOTE — Addendum Note (Signed)
Addended by: Baldomero Lamy B on: 12/09/2015 01:47 PM   Modules accepted: Orders

## 2015-12-09 NOTE — Progress Notes (Addendum)
Patient [name] C Mallette, female DOB:03-15-1951, 65 y.o. [id number]  Chief Complaint  Patient presents with  . Follow-up    Left knee    HPI  Sidni Dreyfuss Welcome is a 65 y.o. female who has continued pain of the left knee.  It is not getting any better. She has begun to have giving way in addition to the swelling and popping.  The injection last time did not help.  She has no redness.  Her medicine is not helping.  She uses ice sometimes and rests.   She has giving way and medial joint line pain. HPI  Body mass index is 33.28 kg/(m^2).   Review of Systems  HENT: Negative for congestion.   Respiratory: Negative for cough and shortness of breath.   Cardiovascular: Negative for chest pain and leg swelling.       Hypertension  Endocrine: Positive for cold intolerance.  Musculoskeletal: Positive for joint swelling (left knee chronic), arthralgias and gait problem.  Allergic/Immunologic: Positive for environmental allergies.  All other systems reviewed and are negative.   Past Medical History  Diagnosis Date  . Hypertension   . Skin infection     Past Surgical History  Procedure Laterality Date  . Cholecystectomy    . Abcess drainage      suprapubic  . Tubal ligation    . Incision and drainage abscess Right 01/30/2015    Procedure: INCISION AND DRAINAGE RIGHT AXILLARY ABSCESS;  Surgeon: Aviva Signs, MD;  Location: [facility];  Service: General;  Laterality: Right;    Family History  Problem Relation Age of Onset  . Hypertension Mother   . Hypertension Father   . Hypertension Other     Social History Social History  Substance Use Topics  . Smoking status: Never Smoker   . Smokeless tobacco: None  . Alcohol Use: No    Allergies  Allergen Reactions  . Darvocet [Propoxyphene N-Acetaminophen] Nausea And Vomiting  . Toradol [Ketorolac Tromethamine] Nausea And Vomiting    Current Outpatient Prescriptions  Medication Sig Dispense Refill  . acetaminophen (TYLENOL) 325 MG  tablet Take 650 mg by mouth every 6 (six) hours as needed for pain.    Marland Kitchen HYDROcodone-acetaminophen (NORCO) 7.5-325 MG tablet Take 1 tablet by mouth every 4 (four) hours as needed for moderate pain (Must last 30 days.  Do not drive or operate machinery while taking this medicine.). 120 tablet 0  . lisinopril-hydrochlorothiazide (PRINZIDE,ZESTORETIC) 10-12.5 MG per tablet Take 1 tablet by mouth at bedtime.      No current facility-administered medications for this visit.     Physical Exam  Blood pressure 124/77, pulse 81, temperature 97.3 F (36.3 C), height 5\' 4"  (1.626 m), weight 194 lb (87.998 kg).  Constitutional: overall normal hygiene, normal nutrition, well developed, normal grooming, normal body habitus. Assistive device:none  Musculoskeletal: gait and station Limp left, muscle tone and strength are normal, no tremors or atrophy is present.  .  Neurological: coordination overall normal.  Deep tendon reflex/nerve stretch intact.  Sensation normal.  Cranial nerves II-XII intact.   Skin:   normal overall no scars, lesions, ulcers or rashes. No psoriasis.  Psychiatric: Alert and oriented x 3.  Recent memory intact, remote memory unclear.  Normal mood and affect. Well groomed.  Good eye contact.  Cardiovascular: overall no swelling, no varicosities, no edema bilaterally, normal temperatures of the legs and arms, no clubbing, cyanosis and good capillary refill.  Lymphatic: palpation is normal.  The left lower extremity is examined:  Inspection:  Thigh:  Non-tender and no defects  Knee has swelling 2+ effusion.                        Joint tenderness is present                        Patient is tender over the medial joint line  Lower Leg:  Has normal appearance and no tenderness or defects  Ankle:  Non-tender and no defects  Foot:  Non-tender and no defects Range of Motion:  Knee:  Range of motion is: 0-105                        Crepitus is  present  Ankle:  Range of motion  is normal. Strength and Tone:  The left lower extremity has normal strength and tone. Stability:  Knee:  The knee is positive for medial McMurray  Ankle:  The ankle is stable.   Right knee negative.  The patient has been educated about the nature of the problem(s) and counseled on treatment options.  The patient appeared to understand what I have discussed and is in agreement with it.  Encounter Diagnoses  Name Primary?  . Left knee pain Yes  . Essential hypertension     PLAN Call if any problems.  Precautions discussed.  Continue current medications.   Return to clinic after MRI of the left knee.  Added addendum:  Insurance denied as knee pain not a sufficient diagnosis.  More information requested.  She has giving way of the knee on the left. She has medial joint line pain. She has medial positive McMurray on the left. She has recurring effusion and pain not relieved by conservative treatment of greater than six weeks. X-rays on 11-11-15 showed diffuse degenerative changes of the knee but no loose body or fracture. I am concerned about a medial meniscus tear. I am ordering a MRI to confirm this.

## 2015-12-15 ENCOUNTER — Telehealth: Payer: Self-pay | Admitting: *Deleted

## 2015-12-15 NOTE — Telephone Encounter (Signed)
RECEIVED FAX OF MRI DENIAL, DIAGNOSIS OF KNEE PAIN NOT ACCEPTED

## 2015-12-21 ENCOUNTER — Encounter: Payer: Self-pay | Admitting: *Deleted

## 2015-12-21 NOTE — Telephone Encounter (Signed)
This encounter was created in error - please disregard.

## 2015-12-30 ENCOUNTER — Ambulatory Visit (HOSPITAL_COMMUNITY)
Admission: RE | Admit: 2015-12-30 | Discharge: 2015-12-30 | Disposition: A | Discharge status: Home / Self Care | Payer: BLUE CROSS/BLUE SHIELD | Source: Ambulatory Visit | Attending: Orthopaedic Surgery | Admitting: Orthopaedic Surgery

## 2015-12-30 DIAGNOSIS — M25562 Pain in left knee: Secondary | ICD-10-CM | POA: Insufficient documentation

## 2015-12-30 DIAGNOSIS — M94262 Chondromalacia, left knee: Secondary | ICD-10-CM | POA: Insufficient documentation

## 2015-12-31 ENCOUNTER — Encounter: Payer: Self-pay | Admitting: Orthopaedic Surgery

## 2015-12-31 ENCOUNTER — Ambulatory Visit (INDEPENDENT_AMBULATORY_CARE_PROVIDER_SITE_OTHER): Payer: BLUE CROSS/BLUE SHIELD | Admitting: Orthopaedic Surgery

## 2015-12-31 VITALS — BP 125/75 | HR 78 | Temp 97.3°F | Ht 64.0 in | Wt 194.0 lb

## 2015-12-31 DIAGNOSIS — I10 Essential (primary) hypertension: Secondary | ICD-10-CM | POA: Diagnosis not present

## 2015-12-31 DIAGNOSIS — M25562 Pain in left knee: Secondary | ICD-10-CM

## 2015-12-31 NOTE — Progress Notes (Signed)
Patient [name] Megan Barber, female DOB:1951/03/03, 65 y.o. [id number]  Chief Complaint  Patient presents with  . Results    MRI Left knee    HPI  Megan Barber is a 65 y.o. female who has chronic knee pain on the left.  She had a MRI done and it showed:  IMPRESSION: No significant change in the appearance of the left knee since the prior study. 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  I have explained the findings to her.  No surgery is needed. Continue taking her medicine. HPI  Body mass index is 33.28 kg/(m^2).  ROS  Review of Systems  HENT: Negative for congestion.   Respiratory: Negative for cough and shortness of breath.   Cardiovascular: Negative for chest pain and leg swelling.       Hypertension  Endocrine: Positive for cold intolerance.  Musculoskeletal: Positive for joint swelling (left knee chronic), arthralgias and gait problem.  Allergic/Immunologic: Positive for environmental allergies.  All other systems reviewed and are negative.   Past Medical History  Diagnosis Date  . Hypertension   . Skin infection     Past Surgical History  Procedure Laterality Date  . Cholecystectomy    . Abcess drainage      suprapubic  . Tubal ligation    . Incision and drainage abscess Right 01/30/2015    Procedure: INCISION AND DRAINAGE RIGHT AXILLARY ABSCESS;  Surgeon: Aviva Signs, MD;  Location: [facility];  Service: General;  Laterality: Right;    Family History  Problem Relation Age of Onset  . Hypertension Mother   . Hypertension Father   . Hypertension Other     Social History Social History  Substance Use Topics  . Smoking status: Never Smoker   . Smokeless tobacco: Not on file  . Alcohol Use: No    Allergies  Allergen Reactions  . Darvocet [Propoxyphene N-Acetaminophen] Nausea And Vomiting  . Toradol [Ketorolac Tromethamine] Nausea And Vomiting    Current Outpatient Prescriptions  Medication Sig Dispense  Refill  . acetaminophen (TYLENOL) 325 MG tablet Take 650 mg by mouth every 6 (six) hours as needed for pain.    Marland Kitchen HYDROcodone-acetaminophen (NORCO) 7.5-325 MG tablet Take 1 tablet by mouth every 4 (four) hours as needed for moderate pain (Must last 30 days.  Do not drive or operate machinery while taking this medicine.). 120 tablet 0  . lisinopril-hydrochlorothiazide (PRINZIDE,ZESTORETIC) 10-12.5 MG per tablet Take 1 tablet by mouth at bedtime.      No current facility-administered medications for this visit.     Physical Exam  Blood pressure 125/75, pulse 78, temperature 97.3 F (36.3 Megan), height 5\' 4"  (1.626 m), weight 194 lb (87.998 kg).  Constitutional: overall normal hygiene, normal nutrition, well developed, normal grooming, normal body habitus. Assistive device:none  Musculoskeletal: gait and station Limp left, muscle tone and strength are normal, no tremors or atrophy is present.  .  Neurological: coordination overall normal.  Deep tendon reflex/nerve stretch intact.  Sensation normal.  Cranial nerves II-XII intact.   Skin:   normal overall no scars, lesions, ulcers or rashes. No psoriasis.  Psychiatric: Alert and oriented x 3.  Recent memory intact, remote memory unclear.  Normal mood and affect. Well groomed.  Good eye contact.  Cardiovascular: overall no swelling, no varicosities, no edema bilaterally, normal temperatures of the legs and arms, no clubbing, cyanosis and good capillary refill.  Lymphatic: palpation is normal.  The left lower extremity is examined:  Inspection:  Thigh:  Non-tender and no defects  Knee has swelling 1+ effusion.                        Joint tenderness is present                        Patient is tender over the medial joint line  Lower Leg:  Has normal appearance and no tenderness or defects  Ankle:  Non-tender and no defects  Foot:  Non-tender and no defects Range of Motion:  Knee:  Range of motion is: 0-110                         Crepitus is  present  Ankle:  Range of motion is normal. Strength and Tone:  The left lower extremity has normal strength and tone. Stability:  Knee:  The knee is stable.  Ankle:  The ankle is stable.     The patient has been educated about the nature of the problem(s) and counseled on treatment options.  The patient appeared to understand what I have discussed and is in agreement with it.  Encounter Diagnoses  Name Primary?  . Left knee pain Yes  . Essential hypertension     PLAN Call if any problems.  Precautions discussed.  Continue current medications.   Return to clinic 1 month  Electronically Signed Sanjuana Kava, MD 6/1/20173:02 PM

## 2016-01-06 ENCOUNTER — Telehealth: Payer: Self-pay | Admitting: Orthopaedic Surgery

## 2016-01-06 MED ORDER — HYDROCODONE-ACETAMINOPHEN 7.5-325 MG PO TABS: 1.0000 | ORAL_TABLET | ORAL | Status: DC | PRN

## 2016-01-06 NOTE — Telephone Encounter (Signed)
Patient called and requested a refill on Hydrocodone-Acetaminophen (Norco)  7.5-325 mgs. Qty 120 Sig: Take 1 tablet by mouth every 4 (four) hours as needed for moderate pain (Must last 30 days.  Do not drive or operate machinery while taking this medicine.). °

## 2016-01-06 NOTE — Telephone Encounter (Signed)
Rx done. 

## 2016-02-03 ENCOUNTER — Encounter: Payer: Self-pay | Admitting: Orthopaedic Surgery

## 2016-02-03 ENCOUNTER — Ambulatory Visit (INDEPENDENT_AMBULATORY_CARE_PROVIDER_SITE_OTHER): Payer: Self-pay | Admitting: Orthopaedic Surgery

## 2016-02-03 VITALS — BP 133/83 | HR 78 | Temp 97.3°F | Ht 64.0 in | Wt 193.2 lb

## 2016-02-03 DIAGNOSIS — M25562 Pain in left knee: Secondary | ICD-10-CM

## 2016-02-03 DIAGNOSIS — I10 Essential (primary) hypertension: Secondary | ICD-10-CM

## 2016-02-03 MED ORDER — HYDROCODONE-ACETAMINOPHEN 7.5-325 MG PO TABS: 1.0000 | ORAL_TABLET | ORAL | Status: DC | PRN

## 2016-02-03 NOTE — Progress Notes (Signed)
CC:  I have pain of my left knee. I would like an injection.  The patient has chronic pain of the left knee.  There is no recent trauma.  There is no redness.  Injections in the past have helped.  The knee has no redness, has an effusion and crepitus present.  ROM of the knee is 0-105.  Impression:  Chronic knee pain left.  Return: one month  PROCEDURE NOTE:  The patient requests injections of the left knee , verbal consent was obtained.  The left knee was prepped appropriately after time out was performed.   Sterile technique was observed and injection of 1 cc of Depo-Medrol 40 mg with several cc's of plain xylocaine. Anesthesia was provided by ethyl chloride and a 20-gauge needle was used to inject the knee area. The injection was tolerated well.  A band aid dressing was applied.  The patient was advised to apply ice later today and tomorrow to the injection sight as needed.  Electronically Signed Sanjuana Kava, MD 7/5/20172:53 PM

## 2016-03-02 ENCOUNTER — Ambulatory Visit: Payer: Self-pay | Admitting: Orthopaedic Surgery

## 2016-03-03 ENCOUNTER — Telehealth: Payer: Self-pay | Admitting: Orthopaedic Surgery

## 2016-03-03 NOTE — Telephone Encounter (Signed)
Hydrocodone-Acetaminophen 7.5/325mg Qty 120 Tablets °

## 2016-03-08 ENCOUNTER — Telehealth: Payer: Self-pay | Admitting: Orthopaedic Surgery

## 2016-03-08 MED ORDER — HYDROCODONE-ACETAMINOPHEN 7.5-325 MG PO TABS: 1.0000 | ORAL_TABLET | ORAL | 0 refills | Status: DC | PRN

## 2016-03-08 NOTE — Telephone Encounter (Signed)
Patient requests a refill on Hydrocodone/Acetaminophen 7.5-325 mgs.  Qty  120   Sig: Take 1 tablet by mouth every 4 (four) hours as needed for moderate pain (Must last 30 days. Do not drive or operate machinery while taking this medicine.).

## 2016-04-06 ENCOUNTER — Telehealth: Payer: Self-pay | Admitting: Orthopaedic Surgery

## 2016-04-06 MED ORDER — HYDROCODONE-ACETAMINOPHEN 7.5-325 MG PO TABS: 1.0000 | ORAL_TABLET | Freq: Four times a day (QID) | ORAL | 0 refills | Status: DC | PRN

## 2016-04-06 NOTE — Telephone Encounter (Signed)
Hydrocodone-Acetaminophen 7.5/325mg Qty 120 Tablets °

## 2016-05-05 ENCOUNTER — Telehealth: Payer: Self-pay | Admitting: Orthopaedic Surgery

## 2016-05-05 NOTE — Telephone Encounter (Signed)
Hydrocodone-Acetaminophen 7.5/325mg   Qty 100 Tablets  Take 1 tablet by mouth every 6 (six) hours as needed for moderate   pain (Must last 30 days. Do not drive or operate machinery while   taking this medicine.).

## 2016-05-06 ENCOUNTER — Other Ambulatory Visit: Payer: Self-pay | Admitting: *Deleted

## 2016-05-06 MED ORDER — HYDROCODONE-ACETAMINOPHEN 7.5-325 MG PO TABS: 1.0000 | ORAL_TABLET | Freq: Four times a day (QID) | ORAL | 0 refills | Status: DC | PRN

## 2016-05-10 ENCOUNTER — Encounter: Payer: Self-pay | Admitting: Orthopaedic Surgery

## 2016-05-10 ENCOUNTER — Ambulatory Visit (INDEPENDENT_AMBULATORY_CARE_PROVIDER_SITE_OTHER): Payer: Self-pay | Admitting: Orthopaedic Surgery

## 2016-05-10 VITALS — BP 120/80 | HR 80 | Ht 64.0 in | Wt 197.0 lb

## 2016-05-10 DIAGNOSIS — M25562 Pain in left knee: Secondary | ICD-10-CM

## 2016-05-10 DIAGNOSIS — G8929 Other chronic pain: Secondary | ICD-10-CM

## 2016-05-10 NOTE — Progress Notes (Signed)
CC:  I have pain of my left knee. I would like an injection.  The patient has chronic pain of the left knee.  There is no recent trauma.  There is no redness.  Injections in the past have helped.  The knee has no redness, has an effusion and crepitus present.  ROM of the left knee is 0-105.  Impression:  Chronic knee pain left  Return: 1 month  PROCEDURE NOTE:  The patient requests injections of the lft knee, verbal consent was obtained.  The left knee was prepped appropriately after time out was performed.   Sterile technique was observed and injection of 1 cc of Depo-Medrol 40 mg with several cc's of plain xylocaine. Anesthesia was provided by ethyl chloride and a 20-gauge needle was used to inject the knee area. The injection was tolerated well.  A band aid dressing was applied.  The patient was advised to apply ice later today and tomorrow to the injection sight as needed.  Electronically Signed Sanjuana Kava, MD 10/10/20178:45 AM

## 2016-06-07 ENCOUNTER — Encounter: Payer: Self-pay | Admitting: Orthopaedic Surgery

## 2016-06-07 ENCOUNTER — Ambulatory Visit (INDEPENDENT_AMBULATORY_CARE_PROVIDER_SITE_OTHER): Payer: Self-pay | Admitting: Orthopaedic Surgery

## 2016-06-07 VITALS — BP 125/73 | HR 78 | Temp 97.3°F | Ht 64.0 in | Wt 203.0 lb

## 2016-06-07 DIAGNOSIS — M25562 Pain in left knee: Secondary | ICD-10-CM

## 2016-06-07 DIAGNOSIS — G8929 Other chronic pain: Secondary | ICD-10-CM

## 2016-06-07 DIAGNOSIS — I10 Essential (primary) hypertension: Secondary | ICD-10-CM

## 2016-06-07 MED ORDER — HYDROCODONE-ACETAMINOPHEN 7.5-325 MG PO TABS: 1.0000 | ORAL_TABLET | Freq: Four times a day (QID) | ORAL | 0 refills | Status: DC | PRN

## 2016-06-07 NOTE — Progress Notes (Signed)
Patient [name] Megan Barber, female DOB:October 06, 1950, 66 y.o. [id number]  Chief Complaint  Patient presents with  . Follow-up    left knee    HPI  Megan Barber is a 65 y.o. female who has chronic pain of the left knee.  She has no new trauma, no giving way, no locking.  She has swelling and popping.  She is taking her medicine. HPI  Body mass index is 34.84 kg/m.  ROS  Review of Systems  HENT: Negative for congestion.   Respiratory: Negative for cough and shortness of breath.   Cardiovascular: Negative for chest pain and leg swelling.       Hypertension  Endocrine: Positive for cold intolerance.  Musculoskeletal: Positive for arthralgias, gait problem and joint swelling (left knee chronic).  Allergic/Immunologic: Positive for environmental allergies.  All other systems reviewed and are negative.   Past Medical History:  Diagnosis Date  . Hypertension   . Skin infection     Past Surgical History:  Procedure Laterality Date  . ABCESS DRAINAGE     suprapubic  . CHOLECYSTECTOMY    . INCISION AND DRAINAGE ABSCESS Right 01/30/2015   Procedure: INCISION AND DRAINAGE RIGHT AXILLARY ABSCESS;  Surgeon: Aviva Signs, MD;  Location: [facility];  Service: General;  Laterality: Right;  . TUBAL LIGATION      Family History  Problem Relation Age of Onset  . Hypertension Mother   . Hypertension Father   . Hypertension Other     Social History Social History  Substance Use Topics  . Smoking status: Never Smoker  . Smokeless tobacco: Never Used  . Alcohol use No    Allergies  Allergen Reactions  . Darvocet [Propoxyphene N-Acetaminophen] Nausea And Vomiting  . Toradol [Ketorolac Tromethamine] Nausea And Vomiting    Current Outpatient Prescriptions  Medication Sig Dispense Refill  . acetaminophen (TYLENOL) 325 MG tablet Take 650 mg by mouth every 6 (six) hours as needed for pain.    Marland Kitchen HYDROcodone-acetaminophen (NORCO) 7.5-325 MG tablet Take 1 tablet by mouth every 6 (six)  hours as needed for moderate pain (Must last 30 days.Do not drive or operate machinery while taking this medicine.). 90 tablet 0  . lisinopril-hydrochlorothiazide (PRINZIDE,ZESTORETIC) 10-12.5 MG per tablet Take 1 tablet by mouth at bedtime.      No current facility-administered medications for this visit.      Physical Exam  Blood pressure 125/73, pulse 78, temperature 97.3 F (36.3 Megan), height 5\' 4"  (1.626 m), weight 203 lb (92.1 kg).  Constitutional: overall normal hygiene, normal nutrition, well developed, normal grooming, normal body habitus. Assistive device:none  Musculoskeletal: gait and station Limp left, muscle tone and strength are normal, no tremors or atrophy is present.  .  Neurological: coordination overall normal.  Deep tendon reflex/nerve stretch intact.  Sensation normal.  Cranial nerves II-XII intact.   Skin:   Normal overall no scars, lesions, ulcers or rashes. No psoriasis.  Psychiatric: Alert and oriented x 3.  Recent memory intact, remote memory unclear.  Normal mood and affect. Well groomed.  Good eye contact.  Cardiovascular: overall no swelling, no varicosities, no edema bilaterally, normal temperatures of the legs and arms, no clubbing, cyanosis and good capillary refill.  Lymphatic: palpation is normal.  The left lower extremity is examined:  Inspection:  Thigh:  Non-tender and no defects  Knee has swelling 1+ effusion.                        Joint  tenderness is present                        Patient is tender over the medial joint line  Lower Leg:  Has normal appearance and no tenderness or defects  Ankle:  Non-tender and no defects  Foot:  Non-tender and no defects Range of Motion:  Knee:  Range of motion is: 0-105                        Crepitus is  present  Ankle:  Range of motion is normal. Strength and Tone:  The left lower extremity has normal strength and tone. Stability:  Knee:  The knee is stable.  Ankle:  The ankle is  stable.    The patient has been educated about the nature of the problem(s) and counseled on treatment options.  The patient appeared to understand what I have discussed and is in agreement with it.  Encounter Diagnoses  Name Primary?  . Chronic pain of left knee Yes  . Essential hypertension     PLAN Call if any problems.  Precautions discussed.  Continue current medications.   Return to clinic 1 month   Electronically Signed Sanjuana Kava, MD 11/7/20178:42 AM

## 2016-06-27 IMAGING — CR DG ABDOMEN ACUTE W/ 1V CHEST
4 series · 4 of 4 positions shown · non-contrast
Comparison: 02/20/2013

CLINICAL DATA: Lower abdominal pain and cramping.

EXAM:
ACUTE ABDOMEN SERIES (ABDOMEN 2 VIEW & CHEST 1 VIEW)

[view not recorded (1 of 4)]
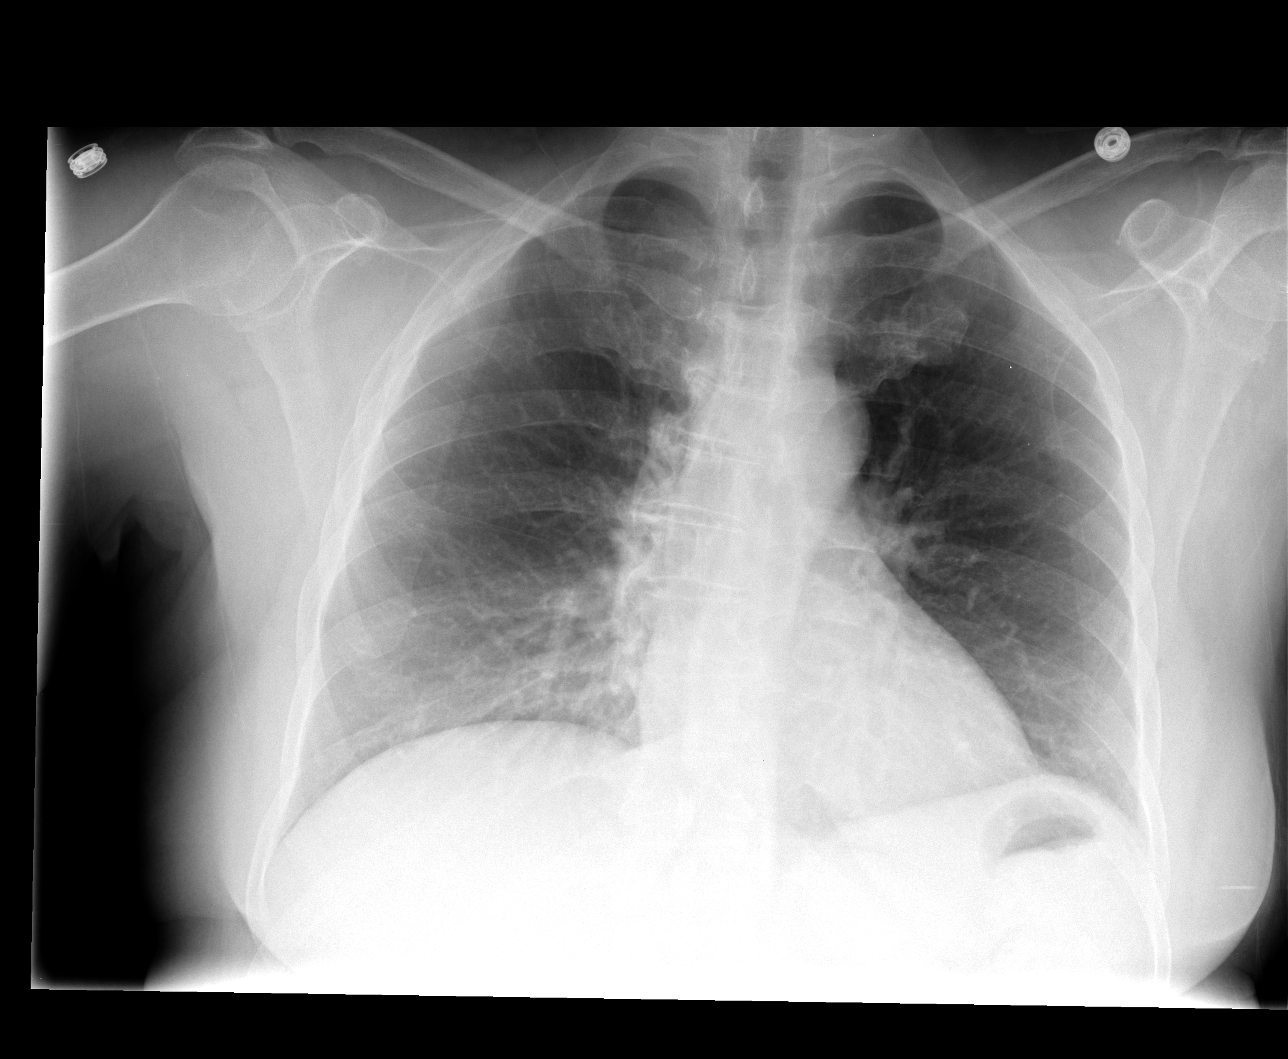

[view not recorded (2 of 4)]
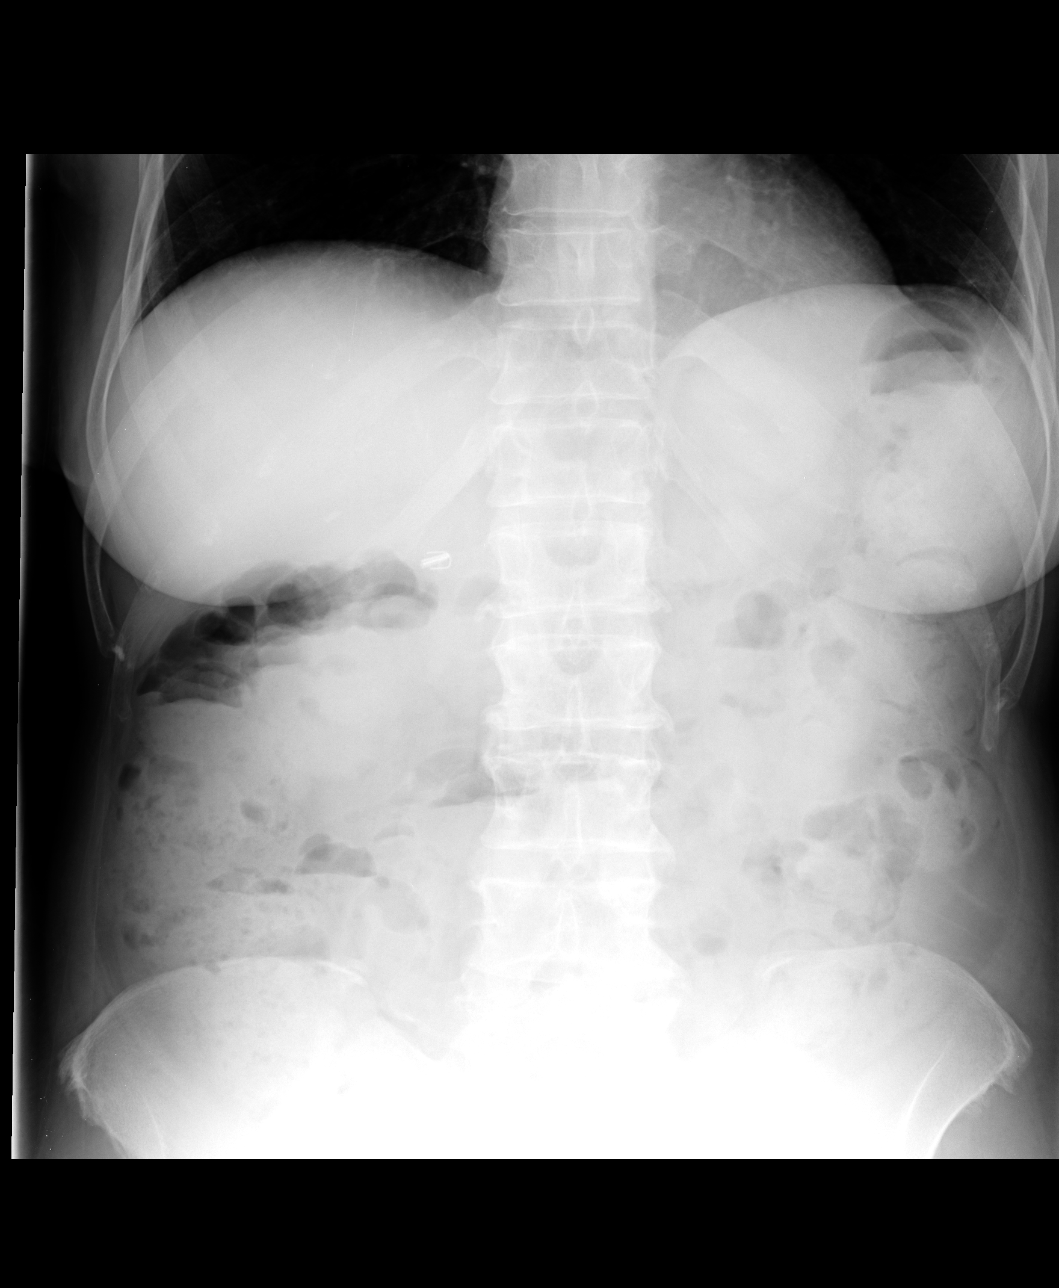

[view not recorded (3 of 4)]
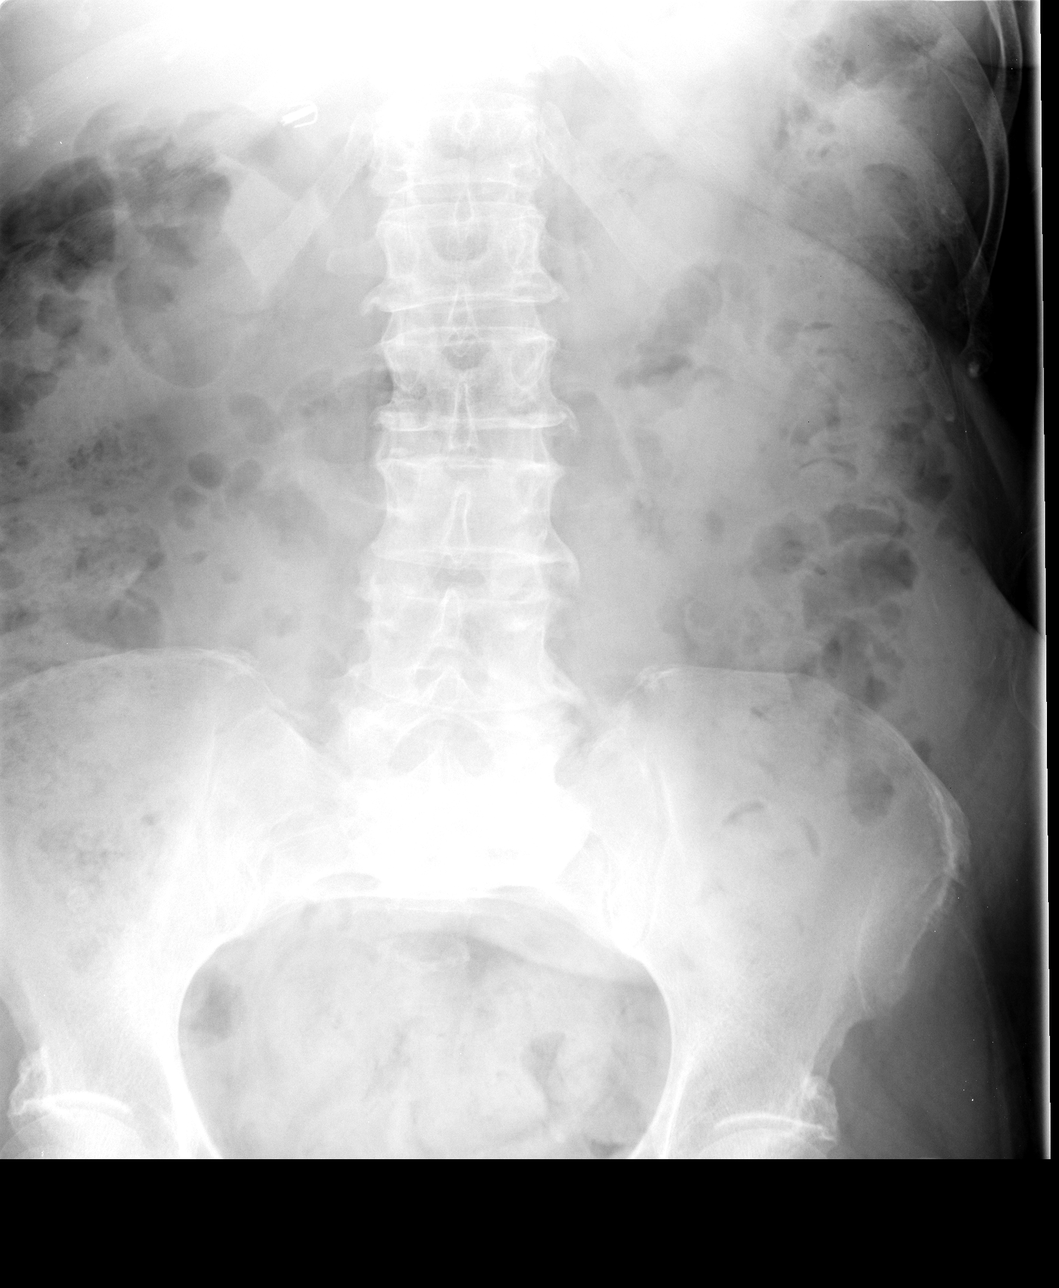

[view not recorded (4 of 4)]
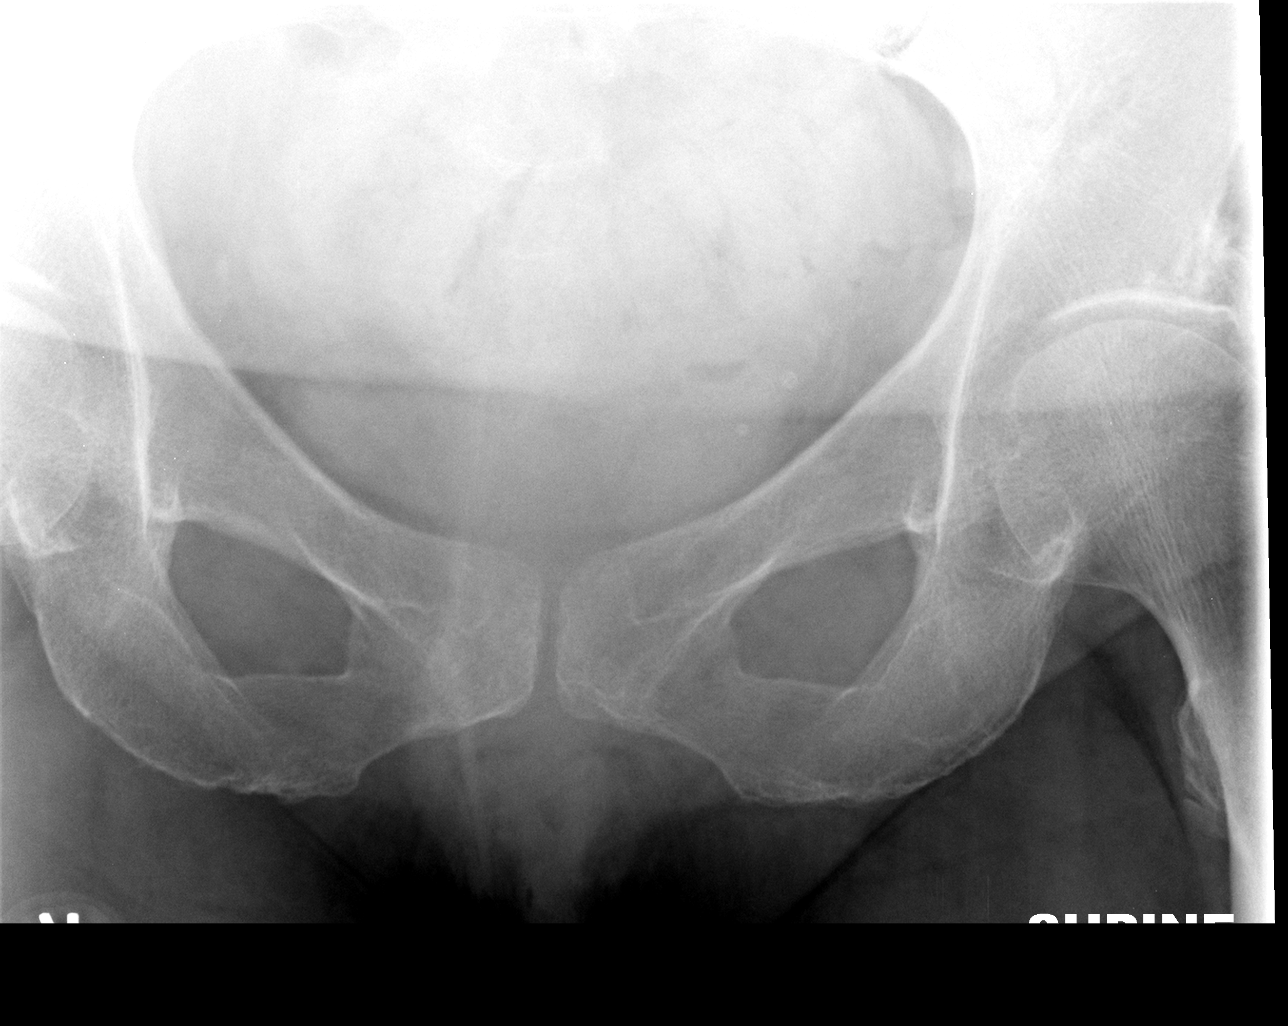

[4 of 4 positions shown; findings below may reference images not displayed]

FINDINGS: Normal heart size and mediastinal contours. No acute infiltrate or
edema. No effusion or pneumothorax. No acute osseous findings.

Large volume of formed stool, present in most colonic segments.
There is no evidence for small bowel obstruction or perforation. No
concerning intra-abdominal mass effect or calcification.
Cholecystectomy clips are noted.
IMPRESSION: 1. Findings compatible with constipation.
2. No evidence of bowel obstruction or perforation.
3. Clear chest.

## 2016-07-07 ENCOUNTER — Encounter: Payer: Self-pay | Admitting: Orthopaedic Surgery

## 2016-07-07 ENCOUNTER — Ambulatory Visit (INDEPENDENT_AMBULATORY_CARE_PROVIDER_SITE_OTHER): Payer: Self-pay | Admitting: Orthopaedic Surgery

## 2016-07-07 VITALS — BP 138/85 | HR 81 | Temp 97.2°F | Ht 64.0 in | Wt 205.0 lb

## 2016-07-07 DIAGNOSIS — G8929 Other chronic pain: Secondary | ICD-10-CM

## 2016-07-07 DIAGNOSIS — I10 Essential (primary) hypertension: Secondary | ICD-10-CM

## 2016-07-07 DIAGNOSIS — M25562 Pain in left knee: Secondary | ICD-10-CM

## 2016-07-07 MED ORDER — HYDROCODONE-ACETAMINOPHEN 7.5-325 MG PO TABS: 1.0000 | ORAL_TABLET | Freq: Four times a day (QID) | ORAL | 0 refills | Status: DC | PRN

## 2016-07-07 NOTE — Progress Notes (Signed)
Patient Megan Barber:[id number] C Molinelli, female DOB:07-21-1951, 65 y.o. [id number]  Chief Complaint  Patient presents with  . Follow-up    left knee pain    HPI  Megan Barber Tool is a 65 y.o. female who has chronic pain of the left knee.  She has swelling and popping but no giving way or locking,.  She has no redness or new trauma.  She is taking her medicine. HPI  Body mass index is 35.19 kg/m.  ROS  Review of Systems  HENT: Negative for congestion.   Respiratory: Negative for cough and shortness of breath.   Cardiovascular: Negative for chest pain and leg swelling.       Hypertension  Endocrine: Positive for cold intolerance.  Musculoskeletal: Positive for arthralgias, gait problem and joint swelling (left knee chronic).  Allergic/Immunologic: Positive for environmental allergies.  All other systems reviewed and are negative.   Past Medical History:  Diagnosis Date  . Hypertension   . Skin infection     Past Surgical History:  Procedure Laterality Date  . ABCESS DRAINAGE     suprapubic  . CHOLECYSTECTOMY    . INCISION AND DRAINAGE ABSCESS Right 01/30/2015   Procedure: INCISION AND DRAINAGE RIGHT AXILLARY ABSCESS;  Surgeon: Aviva Signs, MD;  Location: [facility];  Service: General;  Laterality: Right;  . TUBAL LIGATION      Family History  Problem Relation Age of Onset  . Hypertension Mother   . Hypertension Father   . Hypertension Other     Social History Social History  Substance Use Topics  . Smoking status: Never Smoker  . Smokeless tobacco: Never Used  . Alcohol use No    Allergies  Allergen Reactions  . Darvocet [Propoxyphene N-Acetaminophen] Nausea And Vomiting  . Toradol [Ketorolac Tromethamine] Nausea And Vomiting    Current Outpatient Prescriptions  Medication Sig Dispense Refill  . acetaminophen (TYLENOL) 325 MG tablet Take 650 mg by mouth every 6 (six) hours as needed for pain.    Marland Kitchen HYDROcodone-acetaminophen (NORCO) 7.5-325 MG tablet Take 1 tablet by mouth  every 6 (six) hours as needed for moderate pain (Must last 30 days.Do not drive or operate machinery while taking this medicine.). 90 tablet 0  . lisinopril-hydrochlorothiazide (PRINZIDE,ZESTORETIC) 10-12.5 MG per tablet Take 1 tablet by mouth at bedtime.      No current facility-administered medications for this visit.      Physical Exam  Blood pressure 138/85, pulse 81, temperature 97.2 F (36.2 C), height 5\' 4"  (1.626 m), weight 205 lb (93 kg).  Constitutional: overall normal hygiene, normal nutrition, well developed, normal grooming, normal body habitus. Assistive device:none  Musculoskeletal: gait and station Limp left, muscle tone and strength are normal, no tremors or atrophy is present.  .  Neurological: coordination overall normal.  Deep tendon reflex/nerve stretch intact.  Sensation normal.  Cranial nerves II-XII intact.   Skin:   Normal overall no scars, lesions, ulcers or rashes. No psoriasis.  Psychiatric: Alert and oriented x 3.  Recent memory intact, remote memory unclear.  Normal mood and affect. Well groomed.  Good eye contact.  Cardiovascular: overall no swelling, no varicosities, no edema bilaterally, normal temperatures of the legs and arms, no clubbing, cyanosis and good capillary refill.  Lymphatic: palpation is normal.  The left lower extremity is examined:  Inspection:  Thigh:  Non-tender and no defects  Knee has swelling 1+ effusion.  Joint tenderness is present                        Patient is tender over the medial joint line  Lower Leg:  Has normal appearance and no tenderness or defects  Ankle:  Non-tender and no defects  Foot:  Non-tender and no defects Range of Motion:  Knee:  Range of motion is: 0-105                        Crepitus is  present  Ankle:  Range of motion is normal. Strength and Tone:  The left lower extremity has normal strength and tone. Stability:  Knee:  The knee is stable.  Ankle:  The ankle is  stable.    The patient has been educated about the nature of the problem(s) and counseled on treatment options.  The patient appeared to understand what I have discussed and is in agreement with it.  Encounter Diagnoses  Name Primary?  . Chronic pain of left knee Yes  . Essential hypertension     PLAN Call if any problems.  Precautions discussed.  Continue current medications.   Return to clinic 2 months   Electronically Signed Sanjuana Kava, MD 12/7/20178:37 AM

## 2016-08-04 ENCOUNTER — Telehealth: Payer: Self-pay | Admitting: Orthopaedic Surgery

## 2016-08-04 MED ORDER — HYDROCODONE-ACETAMINOPHEN 7.5-325 MG PO TABS: 1.0000 | ORAL_TABLET | Freq: Four times a day (QID) | ORAL | 0 refills | Status: DC | PRN

## 2016-08-04 NOTE — Telephone Encounter (Signed)
Patient requests refill on Hydrocodone/Acetaminophen 7.5-325  Mgs.   Qty  90 ° °Sig: Take 1 tablet by mouth every 6 (six) hours as needed for moderate pain (Must last 30 days.  Do not drive or operate machinery while taking this medicine.). °

## 2016-09-06 ENCOUNTER — Other Ambulatory Visit (HOSPITAL_COMMUNITY): Payer: Self-pay | Admitting: *Deleted

## 2016-09-06 DIAGNOSIS — Z1231 Encounter for screening mammogram for malignant neoplasm of breast: Secondary | ICD-10-CM

## 2016-09-07 ENCOUNTER — Encounter: Payer: Self-pay | Admitting: Orthopaedic Surgery

## 2016-09-07 ENCOUNTER — Ambulatory Visit (INDEPENDENT_AMBULATORY_CARE_PROVIDER_SITE_OTHER): Payer: Self-pay | Admitting: Orthopaedic Surgery

## 2016-09-07 VITALS — BP 120/74 | HR 83 | Temp 97.3°F | Ht 64.0 in | Wt 208.0 lb

## 2016-09-07 DIAGNOSIS — G8929 Other chronic pain: Secondary | ICD-10-CM

## 2016-09-07 DIAGNOSIS — I10 Essential (primary) hypertension: Secondary | ICD-10-CM

## 2016-09-07 DIAGNOSIS — M25562 Pain in left knee: Secondary | ICD-10-CM

## 2016-09-07 MED ORDER — HYDROCODONE-ACETAMINOPHEN 7.5-325 MG PO TABS: 1.0000 | ORAL_TABLET | Freq: Four times a day (QID) | ORAL | 0 refills | Status: DC | PRN

## 2016-09-07 NOTE — Progress Notes (Signed)
CC:  I have pain of my left knee. I would like an injection.  The patient has chronic pain of the left knee.  There is no recent trauma.  There is no redness.  Injections in the past have helped.  The knee has no redness, has an effusion and crepitus present.  ROM of the left knee is 0-105.  Impression:  Chronic knee pain left  Return: 2 months  PROCEDURE NOTE:  The patient requests injections of the left knee, verbal consent was obtained.  The left knee was prepped appropriately after time out was performed.   Sterile technique was observed and injection of 1 cc of Depo-Medrol 40 mg with several cc's of plain xylocaine. Anesthesia was provided by ethyl chloride and a 20-gauge needle was used to inject the knee area. The injection was tolerated well.  A band aid dressing was applied.  The patient was advised to apply ice later today and tomorrow to the injection sight as needed.  I have reviewed the [place] web site prior to prescribing narcotic medicine for this patient.  Electronically Signed Sanjuana Kava, MD 2/7/20189:17 AM

## 2016-09-12 ENCOUNTER — Ambulatory Visit (HOSPITAL_COMMUNITY)
Admission: RE | Admit: 2016-09-12 | Discharge: 2016-09-12 | Disposition: A | Discharge status: Home / Self Care | Payer: Self-pay | Source: Ambulatory Visit | Attending: *Deleted | Admitting: *Deleted

## 2016-09-12 DIAGNOSIS — Z1231 Encounter for screening mammogram for malignant neoplasm of breast: Secondary | ICD-10-CM

## 2016-10-04 ENCOUNTER — Telehealth: Payer: Self-pay | Admitting: Orthopaedic Surgery

## 2016-10-04 NOTE — Telephone Encounter (Signed)
Hydrocodone-Acetaminophen  7.5/325mg  Qty  80 Tablets °

## 2016-10-10 MED ORDER — HYDROCODONE-ACETAMINOPHEN 7.5-325 MG PO TABS: 1.0000 | ORAL_TABLET | Freq: Four times a day (QID) | ORAL | 0 refills | Status: DC | PRN

## 2016-11-03 ENCOUNTER — Ambulatory Visit: Payer: Self-pay | Admitting: Orthopaedic Surgery

## 2016-11-08 ENCOUNTER — Encounter: Payer: Self-pay | Admitting: Orthopaedic Surgery

## 2016-11-08 ENCOUNTER — Ambulatory Visit (INDEPENDENT_AMBULATORY_CARE_PROVIDER_SITE_OTHER): Payer: Self-pay | Admitting: Orthopaedic Surgery

## 2016-11-08 VITALS — BP 119/72 | HR 90 | Temp 97.5°F | Ht 64.0 in | Wt 209.0 lb

## 2016-11-08 DIAGNOSIS — G8929 Other chronic pain: Secondary | ICD-10-CM

## 2016-11-08 DIAGNOSIS — M25562 Pain in left knee: Secondary | ICD-10-CM

## 2016-11-08 MED ORDER — HYDROCODONE-ACETAMINOPHEN 7.5-325 MG PO TABS: 1.0000 | ORAL_TABLET | Freq: Four times a day (QID) | ORAL | 0 refills | Status: DC | PRN

## 2016-11-08 NOTE — Progress Notes (Signed)
CC:  I have pain of my left knee. I would like an injection.  The patient has chronic pain of the left knee.  There is no recent trauma.  There is no redness.  Injections in the past have helped.  The knee has no redness, has an effusion and crepitus present.  ROM of the left knee is 0-105.  Impression:  Chronic knee pain left  Return: 1 month  PROCEDURE NOTE:  The patient requests injections of the left knee, verbal consent was obtained.  The left knee was prepped appropriately after time out was performed.   Sterile technique was observed and injection of 1 cc of Depo-Medrol 40 mg with several cc's of plain xylocaine. Anesthesia was provided by ethyl chloride and a 20-gauge needle was used to inject the knee area. The injection was tolerated well.  A band aid dressing was applied.  The patient was advised to apply ice later today and tomorrow to the injection sight as needed.  I have reviewed the [place] web site prior to prescribing narcotic medicine for this patient.  Electronically Signed Sanjuana Kava, MD 4/10/201810:27 AM

## 2016-11-10 ENCOUNTER — Ambulatory Visit: Payer: Self-pay | Admitting: Orthopaedic Surgery

## 2016-12-06 ENCOUNTER — Ambulatory Visit (INDEPENDENT_AMBULATORY_CARE_PROVIDER_SITE_OTHER): Payer: Self-pay | Admitting: Orthopaedic Surgery

## 2016-12-06 ENCOUNTER — Encounter: Payer: Self-pay | Admitting: Orthopaedic Surgery

## 2016-12-06 VITALS — BP 123/87 | HR 98 | Temp 97.5°F | Resp 18 | Ht 65.0 in | Wt 201.0 lb

## 2016-12-06 DIAGNOSIS — G8929 Other chronic pain: Secondary | ICD-10-CM

## 2016-12-06 DIAGNOSIS — I10 Essential (primary) hypertension: Secondary | ICD-10-CM

## 2016-12-06 DIAGNOSIS — M25562 Pain in left knee: Secondary | ICD-10-CM

## 2016-12-06 MED ORDER — HYDROCODONE-ACETAMINOPHEN 7.5-325 MG PO TABS: 1.0000 | ORAL_TABLET | Freq: Four times a day (QID) | ORAL | 0 refills | Status: DC | PRN

## 2016-12-06 NOTE — Progress Notes (Signed)
CC:  I have pain of my left knee. I would like an injection.  The patient has chronic pain of the left knee.  There is no recent trauma.  There is no redness.  Injections in the past have helped.  The knee has no redness, has an effusion and crepitus present.  ROM of the left knee is 0-115.  Impression:  Chronic knee pain left  Return: 1 month  PROCEDURE NOTE:  The patient requests injections of the left knee, verbal consent was obtained.  The left knee was prepped appropriately after time out was performed.   Sterile technique was observed and injection of 1 cc of Depo-Medrol 40 mg with several cc's of plain xylocaine. Anesthesia was provided by ethyl chloride and a 20-gauge needle was used to inject the knee area. The injection was tolerated well.  A band aid dressing was applied.  The patient was advised to apply ice later today and tomorrow to the injection sight as needed.  I have reviewed the [place] web site prior to prescribing narcotic medicine for this patient.  Electronically Signed Sanjuana Kava, MD 5/8/20189:09 AM

## 2017-01-03 ENCOUNTER — Ambulatory Visit (INDEPENDENT_AMBULATORY_CARE_PROVIDER_SITE_OTHER): Payer: Self-pay | Admitting: Orthopaedic Surgery

## 2017-01-03 VITALS — BP 116/71 | HR 77 | Temp 97.0°F | Ht 65.0 in | Wt 207.0 lb

## 2017-01-03 DIAGNOSIS — M25562 Pain in left knee: Secondary | ICD-10-CM

## 2017-01-03 DIAGNOSIS — G8929 Other chronic pain: Secondary | ICD-10-CM

## 2017-01-03 MED ORDER — HYDROCODONE-ACETAMINOPHEN 7.5-325 MG PO TABS: 1.0000 | ORAL_TABLET | Freq: Four times a day (QID) | ORAL | 0 refills | Status: DC | PRN

## 2017-01-03 NOTE — Progress Notes (Signed)
CC:  I have pain of my left knee. I would like an injection.  The patient has chronic pain of the left knee.  There is no recent trauma.  There is no redness.  Injections in the past have helped.  The knee has no redness, has an effusion and crepitus present.  ROM of the left knee is 0-110.  Impression:  Chronic knee pain left  Return: 1 month  PROCEDURE NOTE:  The patient requests injections of the left knee, verbal consent was obtained.  The left knee was prepped appropriately after time out was performed.   Sterile technique was observed and injection of 1 cc of Depo-Medrol 40 mg with several cc's of plain xylocaine. Anesthesia was provided by ethyl chloride and a 20-gauge needle was used to inject the knee area. The injection was tolerated well.  A band aid dressing was applied.  The patient was advised to apply ice later today and tomorrow to the injection sight as needed.  I have reviewed the [place] web site prior to prescribing narcotic medicine for this patient.  Electronically Signed Sanjuana Kava, MD 6/5/20189:21 AM

## 2017-01-05 ENCOUNTER — Ambulatory Visit: Payer: Self-pay | Admitting: Orthopaedic Surgery

## 2017-01-31 ENCOUNTER — Ambulatory Visit: Payer: Self-pay | Admitting: Orthopaedic Surgery

## 2017-02-07 ENCOUNTER — Ambulatory Visit (INDEPENDENT_AMBULATORY_CARE_PROVIDER_SITE_OTHER): Payer: Self-pay | Admitting: Orthopaedic Surgery

## 2017-02-07 ENCOUNTER — Encounter: Payer: Self-pay | Admitting: Orthopaedic Surgery

## 2017-02-07 DIAGNOSIS — I10 Essential (primary) hypertension: Secondary | ICD-10-CM

## 2017-02-07 DIAGNOSIS — M25562 Pain in left knee: Secondary | ICD-10-CM

## 2017-02-07 DIAGNOSIS — G8929 Other chronic pain: Secondary | ICD-10-CM

## 2017-02-07 MED ORDER — HYDROCODONE-ACETAMINOPHEN 7.5-325 MG PO TABS: 1.0000 | ORAL_TABLET | Freq: Four times a day (QID) | ORAL | 0 refills | Status: DC | PRN

## 2017-02-07 NOTE — Progress Notes (Signed)
CC:  I have pain of my left knee. I would like an injection.  The patient has chronic pain of the left knee.  There is no recent trauma.  There is no redness.  Injections in the past have helped.  The knee has no redness, has an effusion and crepitus present.  ROM of the left knee is 0-110.  Impression:  Chronic knee pain left  Return: 1 month  PROCEDURE NOTE:  The patient requests injections of the left knee, verbal consent was obtained.  The left knee was prepped appropriately after time out was performed.   Sterile technique was observed and injection of 1 cc of Depo-Medrol 40 mg with several cc's of plain xylocaine. Anesthesia was provided by ethyl chloride and a 20-gauge needle was used to inject the knee area. The injection was tolerated well.  A band aid dressing was applied.  The patient was advised to apply ice later today and tomorrow to the injection sight as needed.  I have reviewed the [place] web site prior to prescribing narcotic medicine for this patient. Electronically Signed Sanjuana Kava, MD 7/10/20188:31 AM

## 2017-03-09 ENCOUNTER — Ambulatory Visit (INDEPENDENT_AMBULATORY_CARE_PROVIDER_SITE_OTHER): Payer: Self-pay | Admitting: Orthopaedic Surgery

## 2017-03-09 ENCOUNTER — Encounter: Payer: Self-pay | Admitting: Orthopaedic Surgery

## 2017-03-09 VITALS — BP 139/95 | HR 99 | Temp 97.9°F | Ht 64.0 in | Wt 196.0 lb

## 2017-03-09 DIAGNOSIS — M25562 Pain in left knee: Secondary | ICD-10-CM

## 2017-03-09 DIAGNOSIS — I10 Essential (primary) hypertension: Secondary | ICD-10-CM

## 2017-03-09 DIAGNOSIS — G8929 Other chronic pain: Secondary | ICD-10-CM

## 2017-03-09 MED ORDER — HYDROCODONE-ACETAMINOPHEN 7.5-325 MG PO TABS: 1.0000 | ORAL_TABLET | Freq: Four times a day (QID) | ORAL | 0 refills | Status: DC | PRN

## 2017-03-09 NOTE — Progress Notes (Signed)
CC:  I have pain of my left knee. I would like an injection.  The patient has chronic pain of the left knee.  There is no recent trauma.  There is no redness.  Injections in the past have helped.  The knee has no redness, has an effusion and crepitus present.  ROM of the left knee is 0-110.  Impression:  Chronic knee pain left  Return: 1 month  PROCEDURE NOTE:  The patient requests injections of the left knee, verbal consent was obtained.  The left knee was prepped appropriately after time out was performed.   Sterile technique was observed and injection of 1 cc of Depo-Medrol 40 mg with several cc's of plain xylocaine. Anesthesia was provided by ethyl chloride and a 20-gauge needle was used to inject the knee area. The injection was tolerated well.  A band aid dressing was applied.  The patient was advised to apply ice later today and tomorrow to the injection sight as needed.  I have reviewed the [place] web site prior to prescribing narcotic medicine for this patient.  Electronically Signed Sanjuana Kava, MD 8/9/20188:52 AM

## 2017-04-06 ENCOUNTER — Ambulatory Visit (INDEPENDENT_AMBULATORY_CARE_PROVIDER_SITE_OTHER): Payer: Self-pay | Admitting: Orthopaedic Surgery

## 2017-04-06 DIAGNOSIS — G8929 Other chronic pain: Secondary | ICD-10-CM

## 2017-04-06 DIAGNOSIS — I10 Essential (primary) hypertension: Secondary | ICD-10-CM

## 2017-04-06 DIAGNOSIS — M25562 Pain in left knee: Secondary | ICD-10-CM

## 2017-04-06 MED ORDER — HYDROCODONE-ACETAMINOPHEN 7.5-325 MG PO TABS: 1.0000 | ORAL_TABLET | Freq: Four times a day (QID) | ORAL | 0 refills | Status: DC | PRN

## 2017-04-06 NOTE — Progress Notes (Signed)
CC:  I have pain of my left knee. I would like an injection.  The patient has chronic pain of the left knee.  There is no recent trauma.  There is no redness.  Injections in the past have helped.  The knee has no redness, has an effusion and crepitus present.  ROM of the left knee is 0-110.  Impression:  Chronic knee pain left  Return: 1 month  PROCEDURE NOTE:  The patient requests injections of the left knee, verbal consent was obtained.  The left knee was prepped appropriately after time out was performed.   Sterile technique was observed and injection of 1 cc of Depo-Medrol 40 mg with several cc's of plain xylocaine. Anesthesia was provided by ethyl chloride and a 20-gauge needle was used to inject the knee area. The injection was tolerated well.  A band aid dressing was applied.  The patient was advised to apply ice later today and tomorrow to the injection sight as needed.  I have reviewed the [place] web site prior to prescribing narcotic medicine for this patient.  Electronically Signed Sanjuana Kava, MD 9/6/20189:26 AM

## 2017-05-09 ENCOUNTER — Ambulatory Visit (INDEPENDENT_AMBULATORY_CARE_PROVIDER_SITE_OTHER): Payer: Self-pay | Admitting: Orthopaedic Surgery

## 2017-05-09 ENCOUNTER — Encounter: Payer: Self-pay | Admitting: Orthopaedic Surgery

## 2017-05-09 VITALS — BP 121/79 | HR 70 | Temp 97.4°F | Ht 64.0 in | Wt 199.0 lb

## 2017-05-09 DIAGNOSIS — M25562 Pain in left knee: Secondary | ICD-10-CM

## 2017-05-09 DIAGNOSIS — G8929 Other chronic pain: Secondary | ICD-10-CM

## 2017-05-09 MED ORDER — HYDROCODONE-ACETAMINOPHEN 7.5-325 MG PO TABS: 1.0000 | ORAL_TABLET | Freq: Four times a day (QID) | ORAL | 0 refills | Status: DC | PRN

## 2017-05-09 NOTE — Progress Notes (Signed)
CC:  I have pain of my left knee. I would like an injection.  The patient has chronic pain of the left knee.  There is no recent trauma.  There is no redness.  Injections in the past have helped.  The knee has no redness, has an effusion and crepitus present.  ROM of the left knee is 0-105.  Impression:  Chronic knee pain left  Return: 1 month  PROCEDURE NOTE:  The patient requests injections of the left knee, verbal consent was obtained.  The left knee was prepped appropriately after time out was performed.   Sterile technique was observed and injection of 1 cc of Depo-Medrol 40 mg with several cc's of plain xylocaine. Anesthesia was provided by ethyl chloride and a 20-gauge needle was used to inject the knee area. The injection was tolerated well.  A band aid dressing was applied.  The patient was advised to apply ice later today and tomorrow to the injection sight as needed.  I have reviewed the [place] web site prior to prescribing narcotic medicine for this patient. Electronically Signed Sanjuana Kava, MD 10/9/20189:09 AM

## 2017-06-07 ENCOUNTER — Telehealth: Payer: Self-pay | Admitting: Orthopedic Surgery

## 2017-06-07 ENCOUNTER — Other Ambulatory Visit: Payer: Self-pay | Admitting: Orthopedic Surgery

## 2017-06-07 MED ORDER — HYDROCODONE-ACETAMINOPHEN 7.5-325 MG PO TABS: 1.0000 | ORAL_TABLET | Freq: Four times a day (QID) | ORAL | 0 refills | Status: DC | PRN

## 2017-06-07 NOTE — Telephone Encounter (Signed)
Patient of Dr Brooke Bonito has called for refill of pain medication - aware Dr Aline Brochure is reviewing requests while Dr Luna Glasgow is out of clinic this week: HYDROcodone-acetaminophen ([place]) 7.5-325 MG tablet 50 tablet

## 2017-06-13 ENCOUNTER — Ambulatory Visit: Payer: Self-pay | Admitting: Orthopaedic Surgery

## 2017-07-06 ENCOUNTER — Ambulatory Visit: Payer: Self-pay | Admitting: Orthopaedic Surgery

## 2017-07-06 ENCOUNTER — Encounter: Payer: Self-pay | Admitting: Orthopaedic Surgery

## 2017-07-06 DIAGNOSIS — G8929 Other chronic pain: Secondary | ICD-10-CM

## 2017-07-06 DIAGNOSIS — M25562 Pain in left knee: Secondary | ICD-10-CM

## 2017-07-06 MED ORDER — HYDROCODONE-ACETAMINOPHEN 7.5-325 MG PO TABS: 1.0000 | ORAL_TABLET | Freq: Four times a day (QID) | ORAL | 0 refills | Status: DC | PRN

## 2017-07-06 NOTE — Progress Notes (Signed)
CC:  I have pain of my left knee. I would like an injection.  The patient has chronic pain of the left knee.  There is no recent trauma.  There is no redness.  Injections in the past have helped.  The knee has no redness, has an effusion and crepitus present.  ROM of the left knee is 0-110.  Impression:  Chronic knee pain left  Return: 1 month  PROCEDURE NOTE:  The patient requests injections of the left knee, verbal consent was obtained.  The left knee was prepped appropriately after time out was performed.   Sterile technique was observed and injection of 1 cc of Depo-Medrol 40 mg with several cc's of plain xylocaine. Anesthesia was provided by ethyl chloride and a 20-gauge needle was used to inject the knee area. The injection was tolerated well.  A band aid dressing was applied.  The patient was advised to apply ice later today and tomorrow to the injection sight as needed.  I have reviewed the [place] web site prior to prescribing narcotic medicine for this patient.  Electronically Signed Sanjuana Kava, MD 12/6/20188:35 AM

## 2017-08-08 ENCOUNTER — Encounter: Payer: Self-pay | Admitting: Orthopaedic Surgery

## 2017-08-08 ENCOUNTER — Ambulatory Visit (INDEPENDENT_AMBULATORY_CARE_PROVIDER_SITE_OTHER): Payer: Self-pay | Admitting: Orthopaedic Surgery

## 2017-08-08 DIAGNOSIS — M25562 Pain in left knee: Secondary | ICD-10-CM

## 2017-08-08 DIAGNOSIS — I10 Essential (primary) hypertension: Secondary | ICD-10-CM

## 2017-08-08 DIAGNOSIS — G8929 Other chronic pain: Secondary | ICD-10-CM

## 2017-08-08 MED ORDER — HYDROCODONE-ACETAMINOPHEN 7.5-325 MG PO TABS: 1.0000 | ORAL_TABLET | Freq: Four times a day (QID) | ORAL | 0 refills | Status: DC | PRN

## 2017-08-08 NOTE — Progress Notes (Signed)
CC:  I have pain of my left knee. I would like an injection.  The patient has chronic pain of the left knee.  There is no recent trauma.  There is no redness.  Injections in the past have helped.  The knee has no redness, has an effusion and crepitus present.  ROM of the left knee is 0-110.  Impression:  Chronic knee pain left  Return: 1 month  PROCEDURE NOTE:  The patient requests injections of the left knee, verbal consent was obtained.  The left knee was prepped appropriately after time out was performed.   Sterile technique was observed and injection of 1 cc of Depo-Medrol 40 mg with several cc's of plain xylocaine. Anesthesia was provided by ethyl chloride and a 20-gauge needle was used to inject the knee area. The injection was tolerated well.  A band aid dressing was applied.  The patient was advised to apply ice later today and tomorrow to the injection sight as needed.  I have reviewed the [place] web site prior to prescribing narcotic medicine for this patient.  Electronically Signed Sanjuana Kava, MD 1/8/20198:40 AM

## 2017-09-07 ENCOUNTER — Encounter: Payer: Self-pay | Admitting: Orthopaedic Surgery

## 2017-09-07 ENCOUNTER — Ambulatory Visit (INDEPENDENT_AMBULATORY_CARE_PROVIDER_SITE_OTHER): Payer: Medicare Other | Admitting: Orthopaedic Surgery

## 2017-09-07 VITALS — BP 123/78 | HR 70 | Ht 64.0 in | Wt 197.0 lb

## 2017-09-07 DIAGNOSIS — I10 Essential (primary) hypertension: Secondary | ICD-10-CM

## 2017-09-07 DIAGNOSIS — M25562 Pain in left knee: Secondary | ICD-10-CM

## 2017-09-07 DIAGNOSIS — G8929 Other chronic pain: Secondary | ICD-10-CM | POA: Diagnosis not present

## 2017-09-07 MED ORDER — HYDROCODONE-ACETAMINOPHEN 7.5-325 MG PO TABS: 1.0000 | ORAL_TABLET | Freq: Four times a day (QID) | ORAL | 0 refills | Status: DC | PRN

## 2017-09-07 NOTE — Progress Notes (Signed)
CC:  I have pain of my left knee. I would like an injection.  The patient has chronic pain of the left knee.  There is no recent trauma.  There is no redness.  Injections in the past have helped.  The knee has no redness, has an effusion and crepitus present.  ROM of the left knee is 0-110.  Impression:  Chronic knee pain left  Return: 6 weeks  PROCEDURE NOTE:  The patient requests injections of the left knee, verbal consent was obtained.  The left knee was prepped appropriately after time out was performed.   Sterile technique was observed and injection of 1 cc of Depo-Medrol 40 mg with several cc's of plain xylocaine. Anesthesia was provided by ethyl chloride and a 20-gauge needle was used to inject the knee area. The injection was tolerated well.  A band aid dressing was applied.  The patient was advised to apply ice later today and tomorrow to the injection sight as needed.  I have reviewed the [place] web site prior to prescribing narcotic medicine for this patient.  Electronically Signed Sanjuana Kava, MD 2/7/20199:44 AM

## 2017-09-18 ENCOUNTER — Other Ambulatory Visit (HOSPITAL_COMMUNITY): Payer: Self-pay | Admitting: *Deleted

## 2017-09-18 DIAGNOSIS — Z1231 Encounter for screening mammogram for malignant neoplasm of breast: Secondary | ICD-10-CM

## 2017-09-25 ENCOUNTER — Ambulatory Visit (HOSPITAL_COMMUNITY)
Admission: RE | Admit: 2017-09-25 | Discharge: 2017-09-25 | Disposition: A | Discharge status: Home / Self Care | Payer: Medicare Other | Source: Ambulatory Visit | Attending: *Deleted | Admitting: *Deleted

## 2017-09-25 ENCOUNTER — Encounter (HOSPITAL_COMMUNITY): Payer: Self-pay

## 2017-09-25 DIAGNOSIS — Z1231 Encounter for screening mammogram for malignant neoplasm of breast: Secondary | ICD-10-CM | POA: Diagnosis not present

## 2017-09-25 DIAGNOSIS — R928 Other abnormal and inconclusive findings on diagnostic imaging of breast: Secondary | ICD-10-CM | POA: Diagnosis not present

## 2017-10-03 ENCOUNTER — Other Ambulatory Visit: Payer: Self-pay | Admitting: Orthopaedic Surgery

## 2017-10-03 ENCOUNTER — Other Ambulatory Visit: Payer: Self-pay | Admitting: Orthopedic Surgery

## 2017-10-03 MED ORDER — HYDROCODONE-ACETAMINOPHEN 7.5-325 MG PO TABS: 1.0000 | ORAL_TABLET | Freq: Four times a day (QID) | ORAL | 0 refills | Status: DC | PRN

## 2017-10-03 NOTE — Telephone Encounter (Signed)
Patient requests refill (aware while Dr Luna Glasgow is out of clinic until mid-May, Dr Aline Brochure is reviewing refill requests): HYDROcodone-acetaminophen (NORCO) 7.5-325 MG tablet 50 tablet

## 2017-10-04 MED ORDER — HYDROCODONE-ACETAMINOPHEN 7.5-325 MG PO TABS: 1.0000 | ORAL_TABLET | Freq: Four times a day (QID) | ORAL | 0 refills | Status: DC | PRN

## 2017-10-04 NOTE — Telephone Encounter (Signed)
I called [facility] to cancel the Rx sent yesterday. Will you resend the Hydrocodone 7.5 #50 to [place] I have saved pharmacy as Linna Hoff and pended the new Rx

## 2017-10-04 NOTE — Telephone Encounter (Signed)
Patient relays prescription refill issued yesterday, 10/03/17, which was e-scribed to [facility], "should be to [place]."  Please advise.

## 2017-10-18 ENCOUNTER — Ambulatory Visit (INDEPENDENT_AMBULATORY_CARE_PROVIDER_SITE_OTHER): Payer: Medicare Other | Admitting: Orthopedic Surgery

## 2017-10-18 ENCOUNTER — Encounter: Payer: Self-pay | Admitting: Orthopedic Surgery

## 2017-10-18 VITALS — BP 118/76 | HR 75 | Ht 64.0 in | Wt 197.0 lb

## 2017-10-18 DIAGNOSIS — G8929 Other chronic pain: Secondary | ICD-10-CM

## 2017-10-18 DIAGNOSIS — M25562 Pain in left knee: Secondary | ICD-10-CM | POA: Diagnosis not present

## 2017-10-18 NOTE — Progress Notes (Signed)
Progress Note   Patient ID: Megan Barber, female   DOB: [birth year], 67 y.o.   MRN: [id number]  Chief Complaint  Patient presents with  . Knee Pain    left/ wants injection     HPI 67 year old female with chronic knee pain request injection  ROS Current Meds  Medication Sig  . acetaminophen (TYLENOL) 325 MG tablet Take 650 mg by mouth every 6 (six) hours as needed for pain.  Marland Kitchen HYDROcodone-acetaminophen (NORCO) 7.5-325 MG tablet Take 1 tablet by mouth every 6 (six) hours as needed for moderate pain (Must last 30 days).  Marland Kitchen lisinopril-hydrochlorothiazide (PRINZIDE,ZESTORETIC) 10-12.5 MG per tablet Take 1 tablet by mouth at bedtime.     Allergies  Allergen Reactions  . Darvocet [Propoxyphene N-Acetaminophen] Nausea And Vomiting  . Toradol [Ketorolac Tromethamine] Nausea And Vomiting     BP 118/76   Pulse 75   Ht 5\' 4"  (1.626 m)   Wt 197 lb (89.4 kg)   BMI 33.81 kg/m   Physical Exam  The knee looks quiet.  There are 2 incisions from a previous knee arthroscopy which are normal Medical decision-making Encounter Diagnosis  Name Primary?  . Chronic pain of left knee Yes    Procedure note left knee injection verbal consent was obtained to inject left knee joint  Timeout was completed to confirm the site of injection  The medications used were 40 mg of Depo-Medrol and 1% lidocaine 3 cc  Anesthesia was provided by ethyl chloride and the skin was prepped with alcohol.  After cleaning the skin with alcohol a 20-gauge needle was used to inject the left knee joint. There were no complications. A sterile bandage was applied.      Arther Abbott, MD 10/18/2017 10:02 AM

## 2017-10-20 ENCOUNTER — Other Ambulatory Visit (HOSPITAL_COMMUNITY): Payer: Self-pay | Admitting: Family Medicine

## 2017-10-20 DIAGNOSIS — R229 Localized swelling, mass and lump, unspecified: Principal | ICD-10-CM

## 2017-10-20 DIAGNOSIS — IMO0002 Reserved for concepts with insufficient information to code with codable children: Secondary | ICD-10-CM

## 2017-10-24 ENCOUNTER — Ambulatory Visit (HOSPITAL_COMMUNITY)
Admission: RE | Admit: 2017-10-24 | Discharge: 2017-10-24 | Disposition: A | Discharge status: Home / Self Care | Payer: Medicare Other | Source: Ambulatory Visit | Attending: Family Medicine | Admitting: Family Medicine

## 2017-10-24 ENCOUNTER — Encounter (HOSPITAL_COMMUNITY): Payer: Self-pay

## 2017-10-24 ENCOUNTER — Encounter (HOSPITAL_COMMUNITY): Payer: Medicare Other

## 2017-10-24 DIAGNOSIS — IMO0002 Reserved for concepts with insufficient information to code with codable children: Secondary | ICD-10-CM

## 2017-10-24 DIAGNOSIS — R229 Localized swelling, mass and lump, unspecified: Secondary | ICD-10-CM | POA: Diagnosis present

## 2017-11-01 ENCOUNTER — Other Ambulatory Visit: Payer: Self-pay | Admitting: Orthopaedic Surgery

## 2017-11-01 MED ORDER — HYDROCODONE-ACETAMINOPHEN 7.5-325 MG PO TABS: 1.0000 | ORAL_TABLET | Freq: Four times a day (QID) | ORAL | 0 refills | Status: DC | PRN

## 2017-11-01 NOTE — Telephone Encounter (Signed)
Hydrocodone-Acetaminophen  7.5/325 mg  Qty 50 Tablets  Take 1 tablet by mouth every 6 (six) hours as needed for pain.  PATIENT USES [facility].

## 2017-11-02 ENCOUNTER — Encounter: Payer: Self-pay | Admitting: Radiology

## 2017-11-03 ENCOUNTER — Telehealth: Payer: Self-pay | Admitting: Orthopaedic Surgery

## 2017-11-03 NOTE — Telephone Encounter (Signed)
Pharmacy left message on voicemail about patient's refill. Stated it needed prior authorization and they would like to know that since its due on Sunday if it could be filled tomorrow.  Please call and advise

## 2017-11-06 ENCOUNTER — Telehealth: Payer: Self-pay | Admitting: Radiology

## 2017-11-06 NOTE — Telephone Encounter (Signed)
Completed a prior authorization for the Hydrocodone through cover my meds for [facility]

## 2017-11-07 NOTE — Telephone Encounter (Signed)
I completed a Prior auth on cover my meds through [facility], but she does not meet requirements for the prior auth with Medicaid

## 2017-11-24 ENCOUNTER — Ambulatory Visit: Payer: Medicare Other | Admitting: Family Medicine

## 2017-11-24 ENCOUNTER — Encounter: Payer: Self-pay | Admitting: Gastroenterology

## 2017-11-27 ENCOUNTER — Other Ambulatory Visit: Payer: Self-pay | Admitting: Orthopaedic Surgery

## 2017-11-27 DIAGNOSIS — G8929 Other chronic pain: Secondary | ICD-10-CM

## 2017-11-27 DIAGNOSIS — M25562 Pain in left knee: Principal | ICD-10-CM

## 2017-11-27 NOTE — Telephone Encounter (Signed)
Patient of Dr. Brooke Bonito requests refill on Hydrocodone/Acetaminophen 7.5-325  Mgs.   Qty  50  Sig: Take 1 tablet by mouth every 6 (six) hours as needed for moderate pain (Must last 30 days).  Patient states she uses [facility]

## 2017-11-28 MED ORDER — HYDROCODONE-ACETAMINOPHEN 7.5-325 MG PO TABS: 1.0000 | ORAL_TABLET | Freq: Four times a day (QID) | ORAL | 0 refills | Status: DC | PRN

## 2017-12-01 ENCOUNTER — Other Ambulatory Visit (HOSPITAL_COMMUNITY): Payer: Self-pay | Admitting: Family Medicine

## 2017-12-01 DIAGNOSIS — Z780 Asymptomatic menopausal state: Secondary | ICD-10-CM

## 2017-12-04 ENCOUNTER — Other Ambulatory Visit (HOSPITAL_COMMUNITY): Payer: Self-pay | Admitting: Family Medicine

## 2017-12-04 DIAGNOSIS — Z136 Encounter for screening for cardiovascular disorders: Secondary | ICD-10-CM

## 2017-12-08 ENCOUNTER — Ambulatory Visit (HOSPITAL_COMMUNITY)
Admission: RE | Admit: 2017-12-08 | Discharge: 2017-12-08 | Disposition: A | Discharge status: Home / Self Care | Payer: Medicare Other | Source: Ambulatory Visit | Attending: Family Medicine | Admitting: Family Medicine

## 2017-12-08 DIAGNOSIS — Z136 Encounter for screening for cardiovascular disorders: Secondary | ICD-10-CM

## 2017-12-13 ENCOUNTER — Ambulatory Visit (INDEPENDENT_AMBULATORY_CARE_PROVIDER_SITE_OTHER): Payer: Medicare Other

## 2017-12-13 ENCOUNTER — Ambulatory Visit (INDEPENDENT_AMBULATORY_CARE_PROVIDER_SITE_OTHER): Payer: Medicare Other | Admitting: Orthopaedic Surgery

## 2017-12-13 ENCOUNTER — Encounter: Payer: Self-pay | Admitting: Orthopaedic Surgery

## 2017-12-13 VITALS — BP 143/85 | HR 77 | Ht 64.0 in | Wt 203.0 lb

## 2017-12-13 DIAGNOSIS — M25562 Pain in left knee: Secondary | ICD-10-CM

## 2017-12-13 DIAGNOSIS — G8929 Other chronic pain: Secondary | ICD-10-CM | POA: Diagnosis not present

## 2017-12-13 NOTE — Progress Notes (Signed)
CC:  I have pain of my left knee. I would like an injection.  The patient has chronic pain of the left knee.  There is no recent trauma.  There is no redness.  Injections in the past have helped.  The knee has no redness, has an effusion and crepitus present.  ROM of the left knee is 0-100.  Impression:  Chronic knee pain left  Return: 2 months  PROCEDURE NOTE:  The patient requests injections of the left knee, verbal consent was obtained.  The left knee was prepped appropriately after time out was performed.   Sterile technique was observed and injection of 1 cc of Depo-Medrol 40 mg with several cc's of plain xylocaine. Anesthesia was provided by ethyl chloride and a 20-gauge needle was used to inject the knee area. The injection was tolerated well.  A band aid dressing was applied.  The patient was advised to apply ice later today and tomorrow to the injection sight as needed.  X-rays were done today of the left knee, reported separately.  Call if any problem.  Precautions discussed.   Electronically Signed Sanjuana Kava, MD 5/15/20194:00 PM

## 2017-12-14 ENCOUNTER — Ambulatory Visit: Payer: Medicare Other | Admitting: Orthopaedic Surgery

## 2017-12-19 ENCOUNTER — Ambulatory Visit: Payer: Self-pay | Admitting: Orthopaedic Surgery

## 2017-12-20 ENCOUNTER — Ambulatory Visit (HOSPITAL_COMMUNITY)
Admission: RE | Admit: 2017-12-20 | Discharge: 2017-12-20 | Disposition: A | Discharge status: Home / Self Care | Payer: Medicare Other | Source: Ambulatory Visit | Attending: Family Medicine | Admitting: Family Medicine

## 2017-12-20 DIAGNOSIS — Z780 Asymptomatic menopausal state: Secondary | ICD-10-CM

## 2017-12-20 DIAGNOSIS — M810 Age-related osteoporosis without current pathological fracture: Secondary | ICD-10-CM | POA: Diagnosis not present

## 2018-01-01 ENCOUNTER — Telehealth: Payer: Self-pay | Admitting: Orthopaedic Surgery

## 2018-01-01 DIAGNOSIS — G8929 Other chronic pain: Secondary | ICD-10-CM

## 2018-01-01 DIAGNOSIS — M25562 Pain in left knee: Principal | ICD-10-CM

## 2018-01-01 NOTE — Telephone Encounter (Signed)
Patient requests a refill on Hydrocodone/Acetaminophen 7.5-325  Mgs.   Qty  50       Sig: Take 1 tablet by mouth every 6 (six) hours as needed for moderate pain (Must last 30 days).     Patient states she uses [facility]

## 2018-01-02 MED ORDER — HYDROCODONE-ACETAMINOPHEN 7.5-325 MG PO TABS: 1.0000 | ORAL_TABLET | Freq: Four times a day (QID) | ORAL | 0 refills | Status: DC | PRN

## 2018-01-30 ENCOUNTER — Telehealth: Payer: Self-pay | Admitting: Orthopaedic Surgery

## 2018-01-30 DIAGNOSIS — G8929 Other chronic pain: Secondary | ICD-10-CM

## 2018-01-30 DIAGNOSIS — M25562 Pain in left knee: Principal | ICD-10-CM

## 2018-01-30 MED ORDER — HYDROCODONE-ACETAMINOPHEN 7.5-325 MG PO TABS: 1.0000 | ORAL_TABLET | Freq: Four times a day (QID) | ORAL | 0 refills | Status: DC | PRN

## 2018-01-30 NOTE — Telephone Encounter (Signed)
Patient requests refill:  HYDROcodone-acetaminophen (NORCO) 7.5-325 MG tablet 45 tablet 0  -[place]  (patient aware of scheduled appointment 02/27/18)

## 2018-02-12 ENCOUNTER — Other Ambulatory Visit: Payer: Self-pay | Admitting: *Deleted

## 2018-02-12 ENCOUNTER — Encounter: Payer: Self-pay | Admitting: Nurse Practitioner

## 2018-02-12 ENCOUNTER — Telehealth: Payer: Self-pay | Admitting: *Deleted

## 2018-02-12 ENCOUNTER — Ambulatory Visit (INDEPENDENT_AMBULATORY_CARE_PROVIDER_SITE_OTHER): Payer: Medicare HMO | Admitting: Nurse Practitioner

## 2018-02-12 ENCOUNTER — Encounter: Payer: Self-pay | Admitting: *Deleted

## 2018-02-12 DIAGNOSIS — K5900 Constipation, unspecified: Secondary | ICD-10-CM | POA: Diagnosis not present

## 2018-02-12 DIAGNOSIS — K921 Melena: Secondary | ICD-10-CM | POA: Insufficient documentation

## 2018-02-12 MED ORDER — NA SULFATE-K SULFATE-MG SULF 17.5-3.13-1.6 GM/177ML PO SOLN: 1.0000 | ORAL | 0 refills | Status: DC

## 2018-02-12 NOTE — Progress Notes (Addendum)
REVIEWED-NO ADDITIONAL RECOMMENDATIONS.  Primary Care Physician:  Jani Gravel, MD Primary Gastroenterologist:  Dr. Oneida Alar  Chief Complaint  Patient presents with  . Colonoscopy    never had tcs    HPI:   Megan Barber is a 67 y.o. female who presents on referral from primary care to schedule colonoscopy.  Nurse/phone triage was deferred to office visit due to polypharmacy.  Reviewed information provided with the referral including office visit dated 11/14/2017.  The states patient has occurred history and takes omeprazole every other day, not taking Dexilant as previously recommended.  Reports frequent indigestion.  Reviewed CBC, CMP which were all essentially normal.  Recommended referral to GI for colon cancer screening.  No history of colonoscopy in our system.  Today she states she's doing well overall. Has never had a colonoscopy before. Denies abdominal pain, N/V. Has seen intermittent hematochezia, last episode about 1-2 months. Bowels don't move well, typically only with scant toilet tissue hematochezia during an episode of constipation. Denies melena, fever, chills, unintentional weight loss, acute changes in bowel habits. Intermittent GERD which she was on PPI but stopped taking that in favor of baking soda or TUMS. Denies chest pain, dyspnea, dizziness, lightheadedness, syncope, near syncope. Denies any other upper or lower GI symptoms.  Past Medical History:  Diagnosis Date  . Hypertension   . Skin infection     Past Surgical History:  Procedure Laterality Date  . ABCESS DRAINAGE     suprapubic  . CHOLECYSTECTOMY    . INCISION AND DRAINAGE ABSCESS Right 01/30/2015   Procedure: INCISION AND DRAINAGE RIGHT AXILLARY ABSCESS;  Surgeon: Aviva Signs, MD;  Location: [facility];  Service: General;  Laterality: Right;  . TUBAL LIGATION      Current Outpatient Medications  Medication Sig Dispense Refill  . acetaminophen (TYLENOL) 325 MG tablet Take 650 mg by mouth every 6 (six)  hours as needed for pain.    Marland Kitchen HYDROcodone-acetaminophen (NORCO) 7.5-325 MG tablet Take 1 tablet by mouth every 6 (six) hours as needed for moderate pain (Must last 30 days). 45 tablet 0  . lisinopril-hydrochlorothiazide (PRINZIDE,ZESTORETIC) 10-12.5 MG per tablet Take 1 tablet by mouth at bedtime.     . Vitamin D, Ergocalciferol, (DRISDOL) 50000 units CAPS capsule Take 50,000 Units by mouth every 7 (seven) days.    Marland Kitchen zolpidem (AMBIEN) 10 MG tablet Take 1 tablet by mouth at bedtime.     No current facility-administered medications for this visit.     Allergies as of 02/12/2018 - Review Complete 02/12/2018  Allergen Reaction Noted  . Darvocet [propoxyphene n-acetaminophen] Nausea And Vomiting 12/26/2011  . Toradol [ketorolac tromethamine] Nausea And Vomiting 12/26/2011    Family History  Problem Relation Age of Onset  . Hypertension Mother   . Hypertension Father   . Hypertension Other     Social History   Socioeconomic History  . Marital status: Single    Spouse name: Not on file  . Number of children: Not on file  . Years of education: Not on file  . Highest education level: Not on file  Occupational History  . Not on file  Social Needs  . Financial resource strain: Not on file  . Food insecurity:    Worry: Not on file    Inability: Not on file  . Transportation needs:    Medical: Not on file    Non-medical: Not on file  Tobacco Use  . Smoking status: Never Smoker  . Smokeless tobacco: Never Used  Substance and Sexual Activity  . Alcohol use: No  . Drug use: No  . Sexual activity: Never    Birth control/protection: None, Surgical  Lifestyle  . Physical activity:    Days per week: Not on file    Minutes per session: Not on file  . Stress: Not on file  Relationships  . Social connections:    Talks on phone: Not on file    Gets together: Not on file    Attends religious service: Not on file    Active member of club or organization: Not on file    Attends  meetings of clubs or organizations: Not on file    Relationship status: Not on file  . Intimate partner violence:    Fear of current or ex partner: Not on file    Emotionally abused: Not on file    Physically abused: Not on file    Forced sexual activity: Not on file  Other Topics Concern  . Not on file  Social History Narrative  . Not on file    Review of Systems: Complete ROS negative except as per HPI.   Physical Exam: BP (!) 145/95   Pulse 65   Temp 97.9 F (36.6 C) (Oral)   Ht 5\' 5"  (1.651 m)   Wt 203 lb (92.1 kg)   BMI 33.78 kg/m  General:   Alert and oriented. Pleasant and cooperative. Well-nourished and well-developed.  Head:  Normocephalic and atraumatic. Eyes:  Without icterus, sclera clear and conjunctiva pink.  Ears:  Normal auditory acuity. Cardiovascular:  S1, S2 present without murmurs appreciated. Extremities without clubbing or edema. Respiratory:  Clear to auscultation bilaterally. No wheezes, rales, or rhonchi. No distress.  Gastrointestinal:  +BS, soft, non-tender and non-distended. No HSM noted. No guarding or rebound. No masses appreciated.  Rectal:  Deferred  Musculoskalatal:  Symmetrical without gross deformities. Neurologic:  Alert and oriented x4;  grossly normal neurologically. Psych:  Alert and cooperative. Normal mood and affect. Heme/Lymph/Immune: No excessive bruising noted.    02/12/2018 9:39 AM   Disclaimer: This note was dictated with voice recognition software. Similar sounding words can inadvertently be transcribed and may not be corrected upon review.

## 2018-02-12 NOTE — Assessment & Plan Note (Signed)
The patient has ongoing constipation, somewhat mild.  She is not taking anything at this time.  I recommended she take Colace 100 mg daily, fiber supplement daily.  Return for follow-up in 3 months.  Depending on her clinical progression may need prescriptive management.

## 2018-02-12 NOTE — Assessment & Plan Note (Signed)
Intermittent/rare hematochezia in the setting of constipation.  She typically only has scant toilet tissue hematochezia when constipated and straining.  Constipation management as per above.  Incidentally she is due for first ever colonoscopy at this time.  We will proceed with colonoscopy now.  Proceed with colonoscopy on propofol/MAC with Dr. Oneida Alar in the near future. The risks, benefits, and alternatives have been discussed in detail with the patient. They state understanding and desire to proceed.   The patient is currently on Norco for chronic knee pain follows Ambien for insomnia.  No other anticoagulants, anxiolytics, chronic pain medications, or antidepressants.  Due to chronic pain medication and Ambien we will plan for the procedure on propofol/MAC to promote adequate sedation.

## 2018-02-12 NOTE — Patient Instructions (Signed)
1. We will schedule your colonoscopy for you. 2. As we discussed, take Colace stool softener 100 mg over-the-counter, once a day. 3. Go to the pharmacy investigate fiber supplements.  Take a fiber supplement daily to help have better bowel movements. 4. Return for follow-up in 3 months. 5. Call us if you have any questions or concerns.  At [facility] we value your feedback. You may receive a survey about your visit today. Please share your experience as we strive to create trusting relationships with our patients to provide genuine, compassionate, quality care.  It was great to meet you today!  I hope you have a wonderful summer!!

## 2018-02-12 NOTE — Telephone Encounter (Signed)
Pre-op scheduled for 04/24/18 at 9:00am. Letter mailed. Patient aware.

## 2018-02-13 DIAGNOSIS — H52223 Regular astigmatism, bilateral: Secondary | ICD-10-CM | POA: Diagnosis not present

## 2018-02-13 DIAGNOSIS — H25813 Combined forms of age-related cataract, bilateral: Secondary | ICD-10-CM | POA: Diagnosis not present

## 2018-02-13 DIAGNOSIS — H524 Presbyopia: Secondary | ICD-10-CM | POA: Diagnosis not present

## 2018-02-13 DIAGNOSIS — H5203 Hypermetropia, bilateral: Secondary | ICD-10-CM | POA: Diagnosis not present

## 2018-02-13 NOTE — Progress Notes (Signed)
CC'ED TO PCP 

## 2018-02-15 ENCOUNTER — Encounter: Payer: Self-pay | Admitting: Orthopaedic Surgery

## 2018-02-15 ENCOUNTER — Ambulatory Visit (INDEPENDENT_AMBULATORY_CARE_PROVIDER_SITE_OTHER): Payer: Medicare HMO | Admitting: Orthopaedic Surgery

## 2018-02-15 VITALS — BP 168/99 | HR 72 | Temp 97.8°F | Ht 65.0 in | Wt 200.0 lb

## 2018-02-15 DIAGNOSIS — G8929 Other chronic pain: Secondary | ICD-10-CM

## 2018-02-15 DIAGNOSIS — M25562 Pain in left knee: Secondary | ICD-10-CM

## 2018-02-15 NOTE — Progress Notes (Signed)
CC:  I have pain of my left knee. I would like an injection.  The patient has chronic pain of the left knee.  There is no recent trauma.  There is no redness.  Injections in the past have helped.  The knee has no redness, has an effusion and crepitus present.  ROM of the left knee is 0-110.  Impression:  Chronic knee pain left  Return: 1 month  PROCEDURE NOTE:  The patient requests injections of the left knee, verbal consent was obtained.  The left knee was prepped appropriately after time out was performed.   Sterile technique was observed and injection of 1 cc of Depo-Medrol 40 mg with several cc's of plain xylocaine. Anesthesia was provided by ethyl chloride and a 20-gauge needle was used to inject the knee area. The injection was tolerated well.  A band aid dressing was applied.  The patient was advised to apply ice later today and tomorrow to the injection sight as needed.  Electronically Signed Sanjuana Kava, MD 7/18/20199:48 AM

## 2018-02-27 ENCOUNTER — Ambulatory Visit: Payer: Medicare Other | Admitting: Orthopaedic Surgery

## 2018-03-05 DIAGNOSIS — H52223 Regular astigmatism, bilateral: Secondary | ICD-10-CM | POA: Diagnosis not present

## 2018-03-06 ENCOUNTER — Telehealth: Payer: Self-pay | Admitting: Orthopaedic Surgery

## 2018-03-06 DIAGNOSIS — G8929 Other chronic pain: Secondary | ICD-10-CM

## 2018-03-06 DIAGNOSIS — M25562 Pain in left knee: Principal | ICD-10-CM

## 2018-03-06 MED ORDER — HYDROCODONE-ACETAMINOPHEN 7.5-325 MG PO TABS: 1.0000 | ORAL_TABLET | Freq: Four times a day (QID) | ORAL | 0 refills | Status: DC | PRN

## 2018-03-06 NOTE — Telephone Encounter (Signed)
Patient requests refill on Hydrocodone/Acetaminophen 7.5-325  Mgs.  Qty 45  Sig: Take 1 tablet by mouth every 6 (six) hours as needed for moderate pain (Must last 30 days).  Patient states she uses [facility]

## 2018-03-10 DIAGNOSIS — E559 Vitamin D deficiency, unspecified: Secondary | ICD-10-CM | POA: Diagnosis not present

## 2018-03-10 DIAGNOSIS — I10 Essential (primary) hypertension: Secondary | ICD-10-CM | POA: Diagnosis not present

## 2018-03-13 DIAGNOSIS — E559 Vitamin D deficiency, unspecified: Secondary | ICD-10-CM | POA: Diagnosis not present

## 2018-03-13 DIAGNOSIS — I10 Essential (primary) hypertension: Secondary | ICD-10-CM | POA: Diagnosis not present

## 2018-03-13 DIAGNOSIS — Z79899 Other long term (current) drug therapy: Secondary | ICD-10-CM | POA: Diagnosis not present

## 2018-03-13 DIAGNOSIS — M25562 Pain in left knee: Secondary | ICD-10-CM | POA: Diagnosis not present

## 2018-03-13 DIAGNOSIS — M810 Age-related osteoporosis without current pathological fracture: Secondary | ICD-10-CM | POA: Diagnosis not present

## 2018-03-15 ENCOUNTER — Encounter: Payer: Self-pay | Admitting: Orthopaedic Surgery

## 2018-03-15 ENCOUNTER — Ambulatory Visit (INDEPENDENT_AMBULATORY_CARE_PROVIDER_SITE_OTHER): Payer: Medicare HMO | Admitting: Orthopaedic Surgery

## 2018-03-15 DIAGNOSIS — M25562 Pain in left knee: Secondary | ICD-10-CM

## 2018-03-15 DIAGNOSIS — G8929 Other chronic pain: Secondary | ICD-10-CM

## 2018-03-15 NOTE — Progress Notes (Signed)
Patient Megan Barber, female DOB:07-27-1951, 67 y.o. UTM:[id number]  Chief Complaint  Patient presents with  . Knee Pain    left    HPI  Megan Barber is a 67 y.o. female who has knee pain on the left.  She is better after the injection.  She has still pain and swelling but it is much less.  She is more active now.  She does better in the warmer weather. She has no new trauma.  ROS  Review of Systems  HENT: Negative for congestion.   Respiratory: Negative for cough and shortness of breath.   Cardiovascular: Negative for chest pain and leg swelling.       Hypertension  Endocrine: Positive for cold intolerance.  Musculoskeletal: Positive for arthralgias, gait problem and joint swelling (left knee chronic).  Allergic/Immunologic: Positive for environmental allergies.  All other systems reviewed and are negative.   All other systems reviewed and are negative.  Past Medical History:  Diagnosis Date  . Hypertension   . Skin infection     Past Surgical History:  Procedure Laterality Date  . ABCESS DRAINAGE     suprapubic  . CHOLECYSTECTOMY    . INCISION AND DRAINAGE ABSCESS Right 01/30/2015   Procedure: INCISION AND DRAINAGE RIGHT AXILLARY ABSCESS;  Surgeon: Aviva Signs, MD;  Location: [facility];  Service: General;  Laterality: Right;  . TUBAL LIGATION      Family History  Problem Relation Age of Onset  . Hypertension Mother   . Hypertension Father   . Hypertension Other   . Colon cancer Neg Hx     Social History Social History   Tobacco Use  . Smoking status: Never Smoker  . Smokeless tobacco: Never Used  Substance Use Topics  . Alcohol use: No  . Drug use: No    Allergies  Allergen Reactions  . Darvocet [Propoxyphene N-Acetaminophen] Nausea And Vomiting  . Toradol [Ketorolac Tromethamine] Nausea And Vomiting    Current Outpatient Medications  Medication Sig Dispense Refill  . acetaminophen (TYLENOL) 325 MG tablet Take 650 mg by mouth every 6 (six) hours  as needed for pain.    Marland Kitchen HYDROcodone-acetaminophen (NORCO) 7.5-325 MG tablet Take 1 tablet by mouth every 6 (six) hours as needed for moderate pain (Must last 30 days). 45 tablet 0  . lisinopril-hydrochlorothiazide (PRINZIDE,ZESTORETIC) 10-12.5 MG per tablet Take 1 tablet by mouth at bedtime.     . Na Sulfate-K Sulfate-Mg Sulf 17.5-3.13-1.6 GM/177ML SOLN Take 1 kit by mouth as directed. 1 Bottle 0  . Vitamin D, Ergocalciferol, (DRISDOL) 50000 units CAPS capsule Take 50,000 Units by mouth every 7 (seven) days.    Marland Kitchen zolpidem (AMBIEN) 10 MG tablet Take 1 tablet by mouth at bedtime.     No current facility-administered medications for this visit.      Physical Exam  Weight 199, height 83fot 4 inches, a44/82, pulse 64 regular. Constitutional: overall normal hygiene, normal nutrition, well developed, normal grooming, normal body habitus. Assistive device:none  Musculoskeletal: gait and station Limp left, muscle tone and strength are normal, no tremors or atrophy is present.  .  Neurological: coordination overall normal.  Deep tendon reflex/nerve stretch intact.  Sensation normal.  Cranial nerves II-XII intact.   Skin:   Normal overall no scars, lesions, ulcers or rashes. No psoriasis.  Psychiatric: Alert and oriented x 3.  Recent memory intact, remote memory unclear.  Normal mood and affect. Well groomed.  Good eye contact.  Cardiovascular: overall no swelling, no varicosities, no edema  bilaterally, normal temperatures of the legs and arms, no clubbing, cyanosis and good capillary refill.  Lymphatic: palpation is normal.  The left lower extremity is examined:  Inspection:  Thigh:  Non-tender and no defects  Knee has swelling 1+ effusion.                        Joint tenderness is present                        Patient is tender over the medial joint line  Lower Leg:  Has normal appearance and no tenderness or defects  Ankle:  Non-tender and no defects  Foot:  Non-tender and no  defects Range of Motion:  Knee:  Range of motion is: 0-110                        Crepitus is  present  Ankle:  Range of motion is normal. Strength and Tone:  The left lower extremity has normal strength and tone. Stability:  Knee:  The knee is stable.  Ankle:  The ankle is stable.   All other systems reviewed and are negative   The patient has been educated about the nature of the problem(s) and counseled on treatment options.  The patient appeared to understand what I have discussed and is in agreement with it.  Encounter Diagnosis  Name Primary?  . Chronic pain of left knee Yes    PLAN Call if any problems.  Precautions discussed.  Continue current medications.   Return to clinic as needed.   Electronically Signed Sanjuana Kava, MD 8/15/20199:22 AM

## 2018-04-03 ENCOUNTER — Telehealth: Payer: Self-pay | Admitting: Orthopaedic Surgery

## 2018-04-03 DIAGNOSIS — G8929 Other chronic pain: Secondary | ICD-10-CM

## 2018-04-03 DIAGNOSIS — M25562 Pain in left knee: Principal | ICD-10-CM

## 2018-04-03 MED ORDER — HYDROCODONE-ACETAMINOPHEN 7.5-325 MG PO TABS: 1.0000 | ORAL_TABLET | Freq: Four times a day (QID) | ORAL | 0 refills | Status: DC | PRN

## 2018-04-03 NOTE — Telephone Encounter (Signed)
Hydrocodone-Acetaminophen  7.5/325 mg  Qty 45 Tablets  PATIENT USES [facility]  States she will pick up on 9/6

## 2018-04-09 ENCOUNTER — Emergency Department (HOSPITAL_COMMUNITY)
Admission: EM | Admit: 2018-04-09 | Discharge: 2018-04-09 | Disposition: A | Discharge status: Home / Self Care | Payer: Medicare HMO | Attending: Emergency Medicine | Admitting: Emergency Medicine

## 2018-04-09 ENCOUNTER — Emergency Department (HOSPITAL_COMMUNITY): Payer: Medicare HMO

## 2018-04-09 ENCOUNTER — Encounter (HOSPITAL_COMMUNITY): Payer: Self-pay | Admitting: Emergency Medicine

## 2018-04-09 ENCOUNTER — Other Ambulatory Visit: Payer: Self-pay

## 2018-04-09 DIAGNOSIS — I10 Essential (primary) hypertension: Secondary | ICD-10-CM | POA: Diagnosis not present

## 2018-04-09 DIAGNOSIS — R42 Dizziness and giddiness: Secondary | ICD-10-CM | POA: Diagnosis not present

## 2018-04-09 DIAGNOSIS — J9811 Atelectasis: Secondary | ICD-10-CM | POA: Diagnosis not present

## 2018-04-09 DIAGNOSIS — Z79899 Other long term (current) drug therapy: Secondary | ICD-10-CM | POA: Insufficient documentation

## 2018-04-09 LAB — COMPREHENSIVE METABOLIC PANEL
ALT: 10 U/L (ref 0–44)
AST: 18 U/L (ref 15–41)
Albumin: 4.2 g/dL (ref 3.5–5.0)
Alkaline Phosphatase: 53 U/L (ref 38–126)
Anion gap: 7 (ref 5–15)
BUN: 10 mg/dL (ref 8–23)
CHLORIDE: 105 mmol/L (ref 98–111)
CO2: 29 mmol/L (ref 22–32)
Calcium: 9.4 mg/dL (ref 8.9–10.3)
Creatinine, Ser: 0.91 mg/dL (ref 0.44–1.00)
GLUCOSE: 110 mg/dL — AB (ref 70–99)
Potassium: 3.4 mmol/L — ABNORMAL LOW (ref 3.5–5.1)
SODIUM: 141 mmol/L (ref 135–145)
TOTAL PROTEIN: 7.9 g/dL (ref 6.5–8.1)
Total Bilirubin: 0.6 mg/dL (ref 0.3–1.2)

## 2018-04-09 LAB — CBG MONITORING, ED: Glucose-Capillary: 113 mg/dL — ABNORMAL HIGH (ref 70–99)

## 2018-04-09 LAB — CBC WITH DIFFERENTIAL/PLATELET
Basophils Absolute: 0 10*3/uL (ref 0.0–0.1)
Basophils Relative: 0 %
Eosinophils Absolute: 0.1 10*3/uL (ref 0.0–0.7)
Eosinophils Relative: 1 %
HCT: 39.9 % (ref 36.0–46.0)
Hemoglobin: 12.6 g/dL (ref 12.0–15.0)
LYMPHS ABS: 2 10*3/uL (ref 0.7–4.0)
LYMPHS PCT: 36 %
MCH: 28.3 pg (ref 26.0–34.0)
MCHC: 31.6 g/dL (ref 30.0–36.0)
MCV: 89.5 fL (ref 78.0–100.0)
Monocytes Absolute: 0.5 10*3/uL (ref 0.1–1.0)
Monocytes Relative: 9 %
NEUTROS PCT: 54 %
Neutro Abs: 3 10*3/uL (ref 1.7–7.7)
Platelets: 235 10*3/uL (ref 150–400)
RBC: 4.46 MIL/uL (ref 3.87–5.11)
RDW: 13.9 % (ref 11.5–15.5)
WBC: 5.6 10*3/uL (ref 4.0–10.5)

## 2018-04-09 MED ORDER — ONDANSETRON HCL 4 MG/2ML IJ SOLN
4.0000 mg | Freq: Once | INTRAMUSCULAR | Status: AC
  Administered 2018-04-09: 4 mg via INTRAVENOUS
  Filled 2018-04-09: qty 2

## 2018-04-09 MED ORDER — OXYCODONE-ACETAMINOPHEN 5-325 MG PO TABS
1.0000 | ORAL_TABLET | Freq: Once | ORAL | Status: AC
  Administered 2018-04-09: 1 via ORAL
  Filled 2018-04-09: qty 1

## 2018-04-09 MED ORDER — HYDROMORPHONE HCL 1 MG/ML IJ SOLN
1.0000 mg | Freq: Once | INTRAMUSCULAR | Status: AC
  Administered 2018-04-09: 1 mg via INTRAVENOUS
  Filled 2018-04-09: qty 1

## 2018-04-09 NOTE — Discharge Instructions (Addendum)
Follow-up with your doctor in a week to check your blood pressure

## 2018-04-09 NOTE — ED Triage Notes (Signed)
Pt c/o of "dizzy spell" earlier today. VSS. Nad.

## 2018-04-09 NOTE — ED Provider Notes (Signed)
[facility] Provider Note   CSN: [id number] Arrival date & time: 04/09/18  1740     History   Chief Complaint Chief Complaint  Patient presents with  . Dizziness    HPI Megan Barber is a 67 y.o. female.  Patient complains of some dizziness.  The history is provided by the patient. No language interpreter was used.  Dizziness  Quality:  Lightheadedness Severity:  Mild Onset quality:  Sudden Timing:  Rare Progression:  Resolved Chronicity:  New Context: not when bending over   Associated symptoms: no chest pain, no diarrhea and no headaches     Past Medical History:  Diagnosis Date  . Hypertension   . Skin infection     Patient Active Problem List   Diagnosis Date Noted  . Constipation 02/12/2018  . Hematochezia 02/12/2018  . Chronic pain of left knee 09/16/2015    Past Surgical History:  Procedure Laterality Date  . ABCESS DRAINAGE     suprapubic  . CHOLECYSTECTOMY    . INCISION AND DRAINAGE ABSCESS Right 01/30/2015   Procedure: INCISION AND DRAINAGE RIGHT AXILLARY ABSCESS;  Surgeon: Aviva Signs, MD;  Location: [facility];  Service: General;  Laterality: Right;  . TUBAL LIGATION       OB History    Gravida  3   Para  2   Term  2   Preterm      AB  1   Living        SAB  1   TAB      Ectopic      Multiple      Live Births               Home Medications    Prior to Admission medications   Medication Sig Start Date End Date Taking? Authorizing Provider  acetaminophen (TYLENOL) 325 MG tablet Take 650 mg by mouth every 6 (six) hours as needed for pain.    [provider]  HYDROcodone-acetaminophen (NORCO) 7.5-325 MG tablet Take 1 tablet by mouth every 6 (six) hours as needed for moderate pain (Must last 30 days). 04/03/18   Sanjuana Kava, MD  lisinopril-hydrochlorothiazide (PRINZIDE,ZESTORETIC) 10-12.5 MG per tablet Take 1 tablet by mouth at bedtime.     [provider]  Na Sulfate-K Sulfate-Mg  Sulf 17.5-3.13-1.6 GM/177ML SOLN Take 1 kit by mouth as directed. 02/12/18   Fields, Marga Melnick, MD  Vitamin D, Ergocalciferol, (DRISDOL) 50000 units CAPS capsule Take 50,000 Units by mouth every 7 (seven) days.    [provider]  zolpidem (AMBIEN) 10 MG tablet Take 1 tablet by mouth at bedtime. 01/29/18   [provider]    Family History Family History  Problem Relation Age of Onset  . Hypertension Mother   . Hypertension Father   . Hypertension Other   . Colon cancer Neg Hx     Social History Social History   Tobacco Use  . Smoking status: Never Smoker  . Smokeless tobacco: Never Used  Substance Use Topics  . Alcohol use: No  . Drug use: No     Allergies   Darvocet [propoxyphene n-acetaminophen] and Toradol [ketorolac tromethamine]   Review of Systems Review of Systems  Constitutional: Negative for appetite change and fatigue.  HENT: Negative for congestion, ear discharge and sinus pressure.   Eyes: Negative for discharge.  Respiratory: Negative for cough.   Cardiovascular: Negative for chest pain.  Gastrointestinal: Negative for abdominal pain and diarrhea.  Genitourinary: Negative for  frequency and hematuria.  Musculoskeletal: Negative for back pain.  Skin: Negative for rash.  Neurological: Positive for dizziness. Negative for seizures and headaches.  Psychiatric/Behavioral: Negative for hallucinations.     Physical Exam Updated Vital Signs BP (!) 181/89   Pulse 69   Temp 98.2 F (36.8 C) (Oral)   Resp 18   Ht 5' 5" (1.651 m)   Wt 86.2 kg   SpO2 98%   BMI 31.62 kg/m   Physical Exam  Constitutional: She is oriented to person, place, and time. She appears well-developed.  HENT:  Head: Normocephalic.  Eyes: Conjunctivae and EOM are normal. No scleral icterus.  Neck: Neck supple. No thyromegaly present.  Cardiovascular: Normal rate and regular rhythm. Exam reveals no gallop and no friction rub.  No murmur heard. Pulmonary/Chest: No  stridor. She has no wheezes. She has no rales. She exhibits no tenderness.  Abdominal: She exhibits no distension. There is no tenderness. There is no rebound.  Musculoskeletal: Normal range of motion. She exhibits no edema.  Lymphadenopathy:    She has no cervical adenopathy.  Neurological: She is oriented to person, place, and time. She exhibits normal muscle tone. Coordination normal.  Skin: No rash noted. No erythema.  Psychiatric: She has a normal mood and affect. Her behavior is normal.     ED Treatments / Results  Labs (all labs ordered are listed, but only abnormal results are displayed) Labs Reviewed  COMPREHENSIVE METABOLIC PANEL - Abnormal; Notable for the following components:      Result Value   Potassium 3.4 (*)    Glucose, Bld 110 (*)    All other components within normal limits  CBG MONITORING, ED - Abnormal; Notable for the following components:   Glucose-Capillary 113 (*)    All other components within normal limits  CBC WITH DIFFERENTIAL/PLATELET    EKG None  Radiology Dg Chest 2 View  Result Date: 04/09/2018 CLINICAL DATA:  Headache and chest pain EXAM: CHEST - 2 VIEW COMPARISON:  07/15/2014 FINDINGS: Minimal atelectasis at the left base. No focal consolidation or pleural effusion. Normal heart size. No pneumothorax. IMPRESSION: No active cardiopulmonary disease. Electronically Signed   By: Donavan Foil M.D.   On: 04/09/2018 20:29   Ct Head Wo Contrast  Result Date: 04/09/2018 CLINICAL DATA:  67 year old female with history of dizziness and nausea today. EXAM: CT HEAD WITHOUT CONTRAST TECHNIQUE: Contiguous axial images were obtained from the base of the skull through the vertex without intravenous contrast. COMPARISON:  Head CT 08/25/2013. FINDINGS: Brain: No evidence of acute infarction, hemorrhage, hydrocephalus, extra-axial collection or mass lesion/mass effect. Vascular: No hyperdense vessel or unexpected calcification. Skull: Normal. Negative for fracture  or focal lesion. Sinuses/Orbits: No acute finding. Other: None. IMPRESSION: 1. No acute intracranial abnormalities. Normal appearance of the brain. Electronically Signed   By: Vinnie Langton M.D.   On: 04/09/2018 20:27    Procedures Procedures (including critical care time)  Medications Ordered in ED Medications  oxyCODONE-acetaminophen (PERCOCET/ROXICET) 5-325 MG per tablet 1 tablet (has no administration in time range)  ondansetron (ZOFRAN) injection 4 mg (4 mg Intravenous Given 04/09/18 2000)  HYDROmorphone (DILAUDID) injection 1 mg (1 mg Intravenous Given 04/09/18 2001)     Initial Impression / Assessment and Plan / ED Course  I have reviewed the triage vital signs and the nursing notes.  Pertinent labs & imaging results that were available during my care of the patient were reviewed by me and considered in my medical decision making (see chart  for details).     CT of the head unremarkable.  Labs unremarkable.  Patient improved with treatments.  Her blood pressure remains mildly elevated she will follow-up with her PCP  Final Clinical Impressions(s) / ED Diagnoses   Final diagnoses:  Dizziness  Essential hypertension    ED Discharge Orders    None       Milton Ferguson, MD 04/09/18 2230

## 2018-04-10 DIAGNOSIS — R079 Chest pain, unspecified: Secondary | ICD-10-CM | POA: Diagnosis not present

## 2018-04-10 DIAGNOSIS — R51 Headache: Secondary | ICD-10-CM | POA: Diagnosis not present

## 2018-04-13 DIAGNOSIS — M546 Pain in thoracic spine: Secondary | ICD-10-CM | POA: Diagnosis not present

## 2018-04-13 DIAGNOSIS — M542 Cervicalgia: Secondary | ICD-10-CM | POA: Diagnosis not present

## 2018-04-13 DIAGNOSIS — R42 Dizziness and giddiness: Secondary | ICD-10-CM | POA: Diagnosis not present

## 2018-04-13 DIAGNOSIS — M2560 Stiffness of unspecified joint, not elsewhere classified: Secondary | ICD-10-CM | POA: Diagnosis not present

## 2018-04-16 DIAGNOSIS — R42 Dizziness and giddiness: Secondary | ICD-10-CM | POA: Diagnosis not present

## 2018-04-16 DIAGNOSIS — M546 Pain in thoracic spine: Secondary | ICD-10-CM | POA: Diagnosis not present

## 2018-04-16 DIAGNOSIS — M2560 Stiffness of unspecified joint, not elsewhere classified: Secondary | ICD-10-CM | POA: Diagnosis not present

## 2018-04-16 DIAGNOSIS — M542 Cervicalgia: Secondary | ICD-10-CM | POA: Diagnosis not present

## 2018-04-17 ENCOUNTER — Ambulatory Visit (INDEPENDENT_AMBULATORY_CARE_PROVIDER_SITE_OTHER): Payer: Medicare HMO | Admitting: Orthopaedic Surgery

## 2018-04-17 ENCOUNTER — Encounter: Payer: Self-pay | Admitting: Orthopaedic Surgery

## 2018-04-17 DIAGNOSIS — G8929 Other chronic pain: Secondary | ICD-10-CM

## 2018-04-17 DIAGNOSIS — M25562 Pain in left knee: Secondary | ICD-10-CM

## 2018-04-17 NOTE — Progress Notes (Signed)
CC:  I have pain of my left knee. I would like an injection.  The patient has chronic pain of the left knee.  There is no recent trauma.  There is no redness.  Injections in the past have helped.  The knee has no redness, has an effusion and crepitus present.  ROM of the left knee is 0-110.  Impression:  Chronic knee pain left  Return: 6 weeks  PROCEDURE NOTE:  The patient requests injections of the left knee, verbal consent was obtained.  The left knee was prepped appropriately after time out was performed.   Sterile technique was observed and injection of 1 cc of Depo-Medrol 40 mg with several cc's of plain xylocaine. Anesthesia was provided by ethyl chloride and a 20-gauge needle was used to inject the knee area. The injection was tolerated well.  A band aid dressing was applied.  The patient was advised to apply ice later today and tomorrow to the injection sight as needed.  Electronically Signed Sanjuana Kava, MD 9/17/20199:56 AM

## 2018-04-20 DIAGNOSIS — M542 Cervicalgia: Secondary | ICD-10-CM | POA: Diagnosis not present

## 2018-04-20 DIAGNOSIS — M546 Pain in thoracic spine: Secondary | ICD-10-CM | POA: Diagnosis not present

## 2018-04-20 DIAGNOSIS — M2560 Stiffness of unspecified joint, not elsewhere classified: Secondary | ICD-10-CM | POA: Diagnosis not present

## 2018-04-20 DIAGNOSIS — R42 Dizziness and giddiness: Secondary | ICD-10-CM | POA: Diagnosis not present

## 2018-04-23 ENCOUNTER — Encounter (HOSPITAL_COMMUNITY): Payer: Self-pay

## 2018-04-24 ENCOUNTER — Encounter (HOSPITAL_COMMUNITY)
Admission: RE | Admit: 2018-04-24 | Discharge: 2018-04-24 | Disposition: A | Discharge status: Home / Self Care | Payer: Medicare HMO | Source: Ambulatory Visit | Attending: Gastroenterology | Admitting: Gastroenterology

## 2018-04-25 DIAGNOSIS — M2560 Stiffness of unspecified joint, not elsewhere classified: Secondary | ICD-10-CM | POA: Diagnosis not present

## 2018-04-25 DIAGNOSIS — M546 Pain in thoracic spine: Secondary | ICD-10-CM | POA: Diagnosis not present

## 2018-04-25 DIAGNOSIS — R42 Dizziness and giddiness: Secondary | ICD-10-CM | POA: Diagnosis not present

## 2018-04-25 DIAGNOSIS — M542 Cervicalgia: Secondary | ICD-10-CM | POA: Diagnosis not present

## 2018-04-26 DIAGNOSIS — M542 Cervicalgia: Secondary | ICD-10-CM | POA: Diagnosis not present

## 2018-04-26 DIAGNOSIS — M2560 Stiffness of unspecified joint, not elsewhere classified: Secondary | ICD-10-CM | POA: Diagnosis not present

## 2018-04-26 DIAGNOSIS — M546 Pain in thoracic spine: Secondary | ICD-10-CM | POA: Diagnosis not present

## 2018-04-26 DIAGNOSIS — R42 Dizziness and giddiness: Secondary | ICD-10-CM | POA: Diagnosis not present

## 2018-05-01 ENCOUNTER — Encounter (HOSPITAL_COMMUNITY): Payer: Self-pay | Admitting: *Deleted

## 2018-05-01 ENCOUNTER — Ambulatory Visit (HOSPITAL_COMMUNITY): Payer: Medicare HMO | Admitting: Anesthesiology

## 2018-05-01 ENCOUNTER — Ambulatory Visit (HOSPITAL_COMMUNITY)
Admission: RE | Admit: 2018-05-01 | Discharge: 2018-05-01 | Disposition: A | Discharge status: Home / Self Care | Payer: Medicare HMO | Source: Ambulatory Visit | Attending: Gastroenterology | Admitting: Gastroenterology

## 2018-05-01 ENCOUNTER — Encounter (HOSPITAL_COMMUNITY)
Admission: RE | Disposition: A | Discharge status: Home / Self Care | Payer: Self-pay | Source: Ambulatory Visit | Attending: Gastroenterology

## 2018-05-01 DIAGNOSIS — K921 Melena: Secondary | ICD-10-CM

## 2018-05-01 DIAGNOSIS — Q438 Other specified congenital malformations of intestine: Secondary | ICD-10-CM | POA: Insufficient documentation

## 2018-05-01 DIAGNOSIS — K5904 Chronic idiopathic constipation: Secondary | ICD-10-CM | POA: Insufficient documentation

## 2018-05-01 DIAGNOSIS — D120 Benign neoplasm of cecum: Secondary | ICD-10-CM

## 2018-05-01 DIAGNOSIS — D123 Benign neoplasm of transverse colon: Secondary | ICD-10-CM

## 2018-05-01 DIAGNOSIS — I10 Essential (primary) hypertension: Secondary | ICD-10-CM | POA: Diagnosis not present

## 2018-05-01 HISTORY — PX: POLYPECTOMY: SHX5525

## 2018-05-01 HISTORY — PX: COLONOSCOPY WITH PROPOFOL: SHX5780

## 2018-05-01 SURGERY — COLONOSCOPY WITH PROPOFOL
Anesthesia: General

## 2018-05-01 MED ORDER — LACTATED RINGERS IV SOLN: INTRAVENOUS | Status: DC

## 2018-05-01 MED ORDER — HYDROMORPHONE HCL 1 MG/ML IJ SOLN: 0.2500 mg | INTRAMUSCULAR | Status: DC | PRN

## 2018-05-01 MED ORDER — PROPOFOL 500 MG/50ML IV EMUL
INTRAVENOUS | Status: DC | PRN
  Administered 2018-05-01: 125 ug/kg/min via INTRAVENOUS
  Administered 2018-05-01: 100 ug/kg/min via INTRAVENOUS

## 2018-05-01 MED ORDER — LACTATED RINGERS IV SOLN
INTRAVENOUS | Status: DC
  Administered 2018-05-01: 1000 mL via INTRAVENOUS

## 2018-05-01 MED ORDER — PROPOFOL 10 MG/ML IV BOLUS
INTRAVENOUS | Status: AC
  Filled 2018-05-01: qty 40

## 2018-05-01 MED ORDER — MEPERIDINE HCL 100 MG/ML IJ SOLN: 6.2500 mg | INTRAMUSCULAR | Status: DC | PRN

## 2018-05-01 MED ORDER — HYDROCODONE-ACETAMINOPHEN 7.5-325 MG PO TABS: 1.0000 | ORAL_TABLET | Freq: Once | ORAL | Status: DC | PRN

## 2018-05-01 MED ORDER — PROPOFOL 10 MG/ML IV BOLUS
INTRAVENOUS | Status: DC | PRN
  Administered 2018-05-01 (×2): 40 mg via INTRAVENOUS
  Administered 2018-05-01 (×2): 20 mg via INTRAVENOUS

## 2018-05-01 MED ORDER — PROMETHAZINE HCL 25 MG/ML IJ SOLN: 6.2500 mg | INTRAMUSCULAR | Status: DC | PRN

## 2018-05-01 MED ORDER — CHLORHEXIDINE GLUCONATE CLOTH 2 % EX PADS: 6.0000 | MEDICATED_PAD | Freq: Once | CUTANEOUS | Status: DC

## 2018-05-01 MED ORDER — STERILE WATER FOR IRRIGATION IR SOLN
Status: DC | PRN
  Administered 2018-05-01: 100 mL

## 2018-05-01 MED ORDER — PROPOFOL 10 MG/ML IV BOLUS
INTRAVENOUS | Status: AC
  Filled 2018-05-01: qty 20

## 2018-05-01 NOTE — Op Note (Signed)
[facility] Patient Name: Megan Barber Procedure Date: 05/01/2018 1:57 PM MRN: [id number] Date of Birth: 02-13-1951 Attending MD: Barney Drain MD, MD CSN: [id number] Age: 67 Admit Type: Outpatient Procedure:                Colonoscopy WITH SNARE POLYPECTOMY Indications:              Hematochezia, INTERMITTENT(ON TISSUE WHEN SHE                            WIPES), Chronic idiopathic constipation Providers:                Barney Drain MD, MD, Rosina Lowenstein, RN, Nelma Rothman,                            Technician Referring MD:             Teodora Medici. Kim Medicines:                Propofol per Anesthesia Complications:            No immediate complications. Estimated Blood Loss:     Estimated blood loss was minimal. Procedure:                Pre-Anesthesia Assessment:                           - Prior to the procedure, a History and Physical                            was performed, and patient medications and                            allergies were reviewed. The patient's tolerance of                            previous anesthesia was also reviewed. The risks                            and benefits of the procedure and the sedation                            options and risks were discussed with the patient.                            All questions were answered, and informed consent                            was obtained. Prior Anticoagulants: The patient has                            taken no previous anticoagulant or antiplatelet                            agents. ASA Grade Assessment: II - A patient with  mild systemic disease. After reviewing the risks                            and benefits, the patient was deemed in                            satisfactory condition to undergo the procedure.                            After obtaining informed consent, the colonoscope                            was passed under direct vision. Throughout the              procedure, the patient's blood pressure, pulse, and                            oxygen saturations were monitored continuously. The                            CF-[id number] ([id number]) scope was introduced through                            the anus and advanced to the the cecum, identified                            by appendiceal orifice and ileocecal valve. The                            colonoscopy was somewhat difficult due to a                            tortuous colon. Successful completion of the                            procedure was aided by straightening and shortening                            the scope to obtain bowel loop reduction and                            COLOWRAP. The patient tolerated the procedure well.                            The quality of the bowel preparation was excellent.                            The ileocecal valve, appendiceal orifice, and                            rectum were photographed. Scope In: 2:20:14 PM Scope Out: 2:41:05 PM Scope Withdrawal Time: 0 hours 19 minutes 2 seconds  Total Procedure Duration: 0 hours 20 minutes 51 seconds  Findings:      Three  sessile polyps were found in the proximal transverse colon and       cecum. The polyps were 4 to 6 mm in size. These polyps were removed with       a hot snare. Resection and retrieval were complete.      The recto-sigmoid colon and sigmoid colon were mildly redundant.      The retroflexed view of the distal rectum and anal verge was normal and       showed no anal or rectal abnormalities. Impression:               - Three 4 to 6 mm polyps in the proximal transverse                            colon(2) and in the cecum, removed with a hot                            snare. Resected and retrieved.                           - Redundant LEFT colon.                           - NO OBVIOUS SOURCE FOR HEMATOCHEZIA IDENTIFIED Moderate Sedation:      Per Anesthesia Care Recommendation:            - Patient has a contact number available for                            emergencies. The signs and symptoms of potential                            delayed complications were discussed with the                            patient. Return to normal activities tomorrow.                            Written discharge instructions were provided to the                            patient.                           - High fiber diet.                           - Continue present medications.                           - Await pathology results.                           - Repeat colonoscopy in 3 years for surveillance.                           - Return to my office in 4 months. Procedure Code(s):        ---  Professional ---                           [phone], Colonoscopy, flexible; with removal of                            tumor(s), polyp(s), or other lesion(s) by snare                            technique Diagnosis Code(s):        --- Professional ---                           D12.3, Benign neoplasm of transverse colon (hepatic                            flexure or splenic flexure)                           D12.0, Benign neoplasm of cecum                           K92.1, Melena (includes Hematochezia)                           K59.04, Chronic idiopathic constipation                           Q43.8, Other specified congenital malformations of                            intestine CPT copyright 2017 [employer]. All rights reserved. The codes documented in this report are preliminary and upon coder review may  be revised to meet current compliance requirements. Barney Drain, MD Barney Drain MD, MD 05/01/2018 3:02:40 PM This report has been signed electronically. Number of Addenda: 0

## 2018-05-01 NOTE — Transfer of Care (Signed)
Immediate Anesthesia Transfer of Care Note  Patient: Megan Barber  Procedure(s) Performed: COLONOSCOPY WITH PROPOFOL (N/A ) POLYPECTOMY  Patient Location: PACU  Anesthesia Type:General  Level of Consciousness: awake and patient cooperative  Airway & Oxygen Therapy: Patient Spontanous Breathing  Post-op Assessment: Report given to RN and Post -op Vital signs reviewed and stable  Post vital signs: Reviewed and stable  Last Vitals:  Vitals Value Taken Time  BP    Temp    Pulse    Resp    SpO2      Last Pain:  Vitals:   05/01/18 1443  TempSrc:   PainSc: 0-No pain      Patients Stated Pain Goal: 8 (37/79/39 [id number])  Complications: No apparent anesthesia complications

## 2018-05-01 NOTE — Discharge Instructions (Signed)
You had 3 polyps removed.    DRINK WATER TO KEEP YOUR URINE LIGHT YELLOW.  FOLLOW A HIGH FIBER DIET. AVOID ITEMS THAT CAUSE BLOATING & GAS. SEE INFO BELOW.   CONTINUE YOUR WEIGHT LOSS EFFORTS.  WHILE I DO NOT WANT TO ALARM YOU, YOUR BODY MASS INDEX(BMI) IS OVER 30 WHICH MEANS YOU ARE OBESE. OBESITY IS ASSOCIATED WITH AN INCREASED RISK FOR CIRRHOSIS AND ALL CANCERS, INCLUDING ESOPHAGEAL AND COLON CANCER. A WEIGHT OF 185 LBS    WILL GET YOUR BODY MASS INDEX(BMI) UNDER 30.   YOUR BIOPSY RESULTS WILL BE BACK IN 5 BUSINESS DAYS.  Next colonoscopy in 3 years.    Colonoscopy Care After Read the instructions outlined below and refer to this sheet in the next week. These discharge instructions provide you with general information on caring for yourself after you leave the hospital. While your treatment has been planned according to the most current medical practices available, unavoidable complications occasionally occur. If you have any problems or questions after discharge, call DR. Waneta Fitting, [phone].  ACTIVITY  You may resume your regular activity, but move at a slower pace for the next 24 hours.   Take frequent rest periods for the next 24 hours.   Walking will help get rid of the air and reduce the bloated feeling in your belly (abdomen).   No driving for 24 hours (because of the medicine (anesthesia) used during the test).   You may shower.   Do not sign any important legal documents or operate any machinery for 24 hours (because of the anesthesia used during the test).    NUTRITION  Drink plenty of fluids.   You may resume your normal diet as instructed by your doctor.   Begin with a light meal and progress to your normal diet. Heavy or fried foods are harder to digest and may make you feel sick to your stomach (nauseated).   Avoid alcoholic beverages for 24 hours or as instructed.    MEDICATIONS  You may resume your normal medications.   WHAT YOU CAN EXPECT  TODAY  Some feelings of bloating in the abdomen.   Passage of more gas than usual.   Spotting of blood in your stool or on the toilet paper  .  IF YOU HAD POLYPS REMOVED DURING THE COLONOSCOPY:  Eat a soft diet IF YOU HAVE NAUSEA, BLOATING, ABDOMINAL PAIN, OR VOMITING.    FINDING OUT THE RESULTS OF YOUR TEST Not all test results are available during your visit. DR. Oneida Alar WILL CALL YOU WITHIN 14 DAYS OF YOUR PROCEDUE WITH YOUR RESULTS. Do not assume everything is normal if you have not heard from DR. Aven Christen, CALL HER OFFICE AT [phone].  SEEK IMMEDIATE MEDICAL ATTENTION AND CALL THE OFFICE: [phone] IF:  You have more than a spotting of blood in your stool.   Your belly is swollen (abdominal distention).   You are nauseated or vomiting.   You have a temperature over 101F.   You have abdominal pain or discomfort that is severe or gets worse throughout the day.   High-Fiber Diet A high-fiber diet changes your normal diet to include more whole grains, legumes, fruits, and vegetables. Changes in the diet involve replacing refined carbohydrates with unrefined foods. The calorie level of the diet is essentially unchanged. The Dietary Reference Intake (recommended amount) for adult males is 38 grams per day. For adult females, it is 25 grams per day. Pregnant and lactating women should consume 28 grams of fiber per  day. Fiber is the intact part of a plant that is not broken down during digestion. Functional fiber is fiber that has been isolated from the plant to provide a beneficial effect in the body. PURPOSE  Increase stool bulk.   Ease and regulate bowel movements.   Lower cholesterol.   REDUCE RISK OF COLON CANCER  INDICATIONS THAT YOU NEED MORE FIBER  Constipation and hemorrhoids.   Uncomplicated diverticulosis (intestine condition) and irritable bowel syndrome.   Weight management.   As a protective measure against hardening of the arteries (atherosclerosis),  diabetes, and cancer.   GUIDELINES FOR INCREASING FIBER IN THE DIET  Start adding fiber to the diet slowly. A gradual increase of about 5 more grams (2 slices of whole-wheat bread, 2 servings of most fruits or vegetables, or 1 bowl of high-fiber cereal) per day is best. Too rapid an increase in fiber may result in constipation, flatulence, and bloating.   Drink enough water and fluids to keep your urine clear or pale yellow. Water, juice, or caffeine-free drinks are recommended. Not drinking enough fluid may cause constipation.   Eat a variety of high-fiber foods rather than one type of fiber.   Try to increase your intake of fiber through using high-fiber foods rather than fiber pills or supplements that contain small amounts of fiber.   The goal is to change the types of food eaten. Do not supplement your present diet with high-fiber foods, but replace foods in your present diet.   INCLUDE A VARIETY OF FIBER SOURCES  Replace refined and processed grains with whole grains, canned fruits with fresh fruits, and incorporate other fiber sources. White rice, white breads, and most bakery goods contain little or no fiber.   Brown whole-grain rice, buckwheat oats, and many fruits and vegetables are all good sources of fiber. These include: broccoli, Brussels sprouts, cabbage, cauliflower, beets, sweet potatoes, white potatoes (skin on), carrots, tomatoes, eggplant, squash, berries, fresh fruits, and dried fruits.   Cereals appear to be the richest source of fiber. Cereal fiber is found in whole grains and bran. Bran is the fiber-rich outer coat of cereal grain, which is largely removed in refining. In whole-grain cereals, the bran remains. In breakfast cereals, the largest amount of fiber is found in those with "bran" in their names. The fiber content is sometimes indicated on the label.   You may need to include additional fruits and vegetables each day.   In baking, for 1 cup white flour, you may  use the following substitutions:   1 cup whole-wheat flour minus 2 tablespoons.   1/2 cup white flour plus 1/2 cup whole-wheat flour.   Polyps, Colon  A polyp is extra tissue that grows inside your body. Colon polyps grow in the large intestine. The large intestine, also called the colon, is part of your digestive system. It is a long, hollow tube at the end of your digestive tract where your body makes and stores stool. Most polyps are not dangerous. They are benign. This means they are not cancerous. But over time, some types of polyps can turn into cancer. Polyps that are smaller than a pea are usually not harmful. But larger polyps could someday become or may already be cancerous. To be safe, doctors remove all polyps and test them.   WHO GETS POLYPS? Anyone can get polyps, but certain people are more likely than others. You may have a greater chance of getting polyps if:  You are over 50.   You  have had polyps before.   Someone in your family has had polyps.   Someone in your family has had cancer of the large intestine.   Find out if someone in your family has had polyps. You may also be more likely to get polyps if you:   Eat a lot of fatty foods   Smoke   Drink alcohol   Do not exercise  Eat too much   PREVENTION There is not one sure way to prevent polyps. You might be able to lower your risk of getting them if you:  Eat more fruits and vegetables and less fatty food.   Do not smoke.   Avoid alcohol.   Exercise every day.   Lose weight if you are overweight.   Eating more calcium and folate can also lower your risk of getting polyps. Some foods that are rich in calcium are milk, cheese, and broccoli. Some foods that are rich in folate are chickpeas, kidney beans, and spinach.

## 2018-05-01 NOTE — H&P (Signed)
Primary Care Physician:  Jani Gravel, MD Primary Gastroenterologist:  Dr. Oneida Alar  Pre-Procedure History & Physical: HPI:  Megan Barber is a 67 y.o. female here for [place].  Past Medical History:  Diagnosis Date  . Hypertension   . Skin infection     Past Surgical History:  Procedure Laterality Date  . ABCESS DRAINAGE     suprapubic  . CHOLECYSTECTOMY    . INCISION AND DRAINAGE ABSCESS Right 01/30/2015   Procedure: INCISION AND DRAINAGE RIGHT AXILLARY ABSCESS;  Surgeon: Aviva Signs, MD;  Location: [facility];  Service: General;  Laterality: Right;  . TUBAL LIGATION      Prior to Admission medications   Medication Sig Start Date End Date Taking? Authorizing Provider  acetaminophen (TYLENOL) 325 MG tablet Take 650 mg by mouth every 6 (six) hours as needed for pain.   Yes [provider]  amLODipine (NORVASC) 2.5 MG tablet Take 2.5 mg by mouth every evening. 04/10/18  Yes [provider]  HYDROcodone-acetaminophen (NORCO) 7.5-325 MG tablet Take 1 tablet by mouth every 6 (six) hours as needed for moderate pain (Must last 30 days). 04/03/18  Yes Sanjuana Kava, MD  lisinopril-hydrochlorothiazide (PRINZIDE,ZESTORETIC) 10-12.5 MG per tablet Take 1 tablet by mouth daily.    Yes [provider]  meclizine (ANTIVERT) 25 MG tablet Take 25 mg by mouth 4 (four) times daily as needed for dizziness. 04/10/18  Yes [provider]  Na Sulfate-K Sulfate-Mg Sulf 17.5-3.13-1.6 GM/177ML SOLN Take 1 kit by mouth as directed. 02/12/18  Yes Athene Schuhmacher L, MD  zolpidem (AMBIEN) 10 MG tablet Take 10 mg by mouth at bedtime.  01/29/18  Yes [provider]    Allergies as of 02/12/2018 - Review Complete 02/12/2018  Allergen Reaction Noted  . Darvocet [propoxyphene n-acetaminophen] Nausea And Vomiting 12/26/2011  . Toradol [ketorolac tromethamine] Nausea And Vomiting 12/26/2011    Family History  Problem Relation Age of Onset  . Hypertension Mother   .  Hypertension Father   . Hypertension Other   . Colon cancer Neg Hx     Social History   Socioeconomic History  . Marital status: Single    Spouse name: Not on file  . Number of children: Not on file  . Years of education: Not on file  . Highest education level: Not on file  Occupational History  . Not on file  Social Needs  . Financial resource strain: Not on file  . Food insecurity:    Worry: Not on file    Inability: Not on file  . Transportation needs:    Medical: Not on file    Non-medical: Not on file  Tobacco Use  . Smoking status: Never Smoker  . Smokeless tobacco: Never Used  Substance and Sexual Activity  . Alcohol use: No  . Drug use: No  . Sexual activity: Never    Birth control/protection: None, Surgical  Lifestyle  . Physical activity:    Days per week: Not on file    Minutes per session: Not on file  . Stress: Not on file  Relationships  . Social connections:    Talks on phone: Not on file    Gets together: Not on file    Attends religious service: Not on file    Active member of club or organization: Not on file    Attends meetings of clubs or organizations: Not on file    Relationship status: Not on file  . Intimate partner violence:  Fear of current or ex partner: Not on file    Emotionally abused: Not on file    Physically abused: Not on file    Forced sexual activity: Not on file  Other Topics Concern  . Not on file  Social History Narrative  . Not on file    Review of Systems: See HPI, otherwise negative ROS   Physical Exam: BP 134/86   Pulse 78   Temp 97.8 F (36.6 C) (Oral)   Resp 18   SpO2 96%  General:   Alert,  pleasant and cooperative in NAD Head:  Normocephalic and atraumatic. Neck:  Supple; Lungs:  Clear throughout to auscultation.    Heart:  Regular rate and rhythm. Abdomen:  Soft, nontender and nondistended. Normal bowel sounds, without guarding, and without rebound.   Neurologic:  Alert and  oriented x4;  grossly  normal neurologically.  Impression/Plan:     BRBPR.  Plan:  1. TCS TODAY DISCUSSED PROCEDURE, BENEFITS, & RISKS: < 1% chance of medication reaction, bleeding, perforation, or rupture of spleen/liver.

## 2018-05-01 NOTE — Anesthesia Postprocedure Evaluation (Signed)
Anesthesia Post Note  Patient: Megan Barber  Procedure(s) Performed: COLONOSCOPY WITH PROPOFOL (N/A ) POLYPECTOMY  Patient location during evaluation: PACU Anesthesia Type: General Level of consciousness: awake and alert and patient cooperative Pain management: satisfactory to patient Vital Signs Assessment: post-procedure vital signs reviewed and stable Respiratory status: spontaneous breathing Cardiovascular status: stable Postop Assessment: no apparent nausea or vomiting Anesthetic complications: no     Last Vitals:  Vitals:   05/01/18 1500 05/01/18 1509  BP: 117/70 127/74  Pulse: 70 69  Resp: 18   Temp:  36.4 C  SpO2: 98% 97%    Last Pain:  Vitals:   05/01/18 1509  TempSrc: Oral  PainSc: 0-No pain                 Jamire Shabazz

## 2018-05-01 NOTE — Anesthesia Postprocedure Evaluation (Signed)
Anesthesia Post Note  Patient: Megan Barber  Procedure(s) Performed: COLONOSCOPY WITH PROPOFOL (N/A ) POLYPECTOMY  Patient location during evaluation: PACU Anesthesia Type: General Level of consciousness: awake and alert Pain management: pain level controlled Vital Signs Assessment: post-procedure vital signs reviewed and stable Respiratory status: spontaneous breathing Cardiovascular status: stable Postop Assessment: no apparent nausea or vomiting Anesthetic complications: no     Last Vitals:  Vitals:   05/01/18 1500 05/01/18 1509  BP: 117/70 127/74  Pulse: 70 69  Resp: 18   Temp:  36.4 C  SpO2: 98% 97%    Last Pain:  Vitals:   05/01/18 1509  TempSrc: Oral  PainSc: 0-No pain                 Estle Huguley A

## 2018-05-01 NOTE — Anesthesia Procedure Notes (Signed)
Procedure Name: MAC Date/Time: 05/01/2018 2:06 PM Performed by: Vista Deck, CRNA Pre-anesthesia Checklist: Patient identified, Emergency Drugs available, Suction available, Timeout performed and Patient being monitored Patient Re-evaluated:Patient Re-evaluated prior to induction Oxygen Delivery Method: Nasal Cannula

## 2018-05-01 NOTE — Addendum Note (Signed)
Addendum  created 05/01/18 1545 by Vista Deck, CRNA   Sign clinical note

## 2018-05-01 NOTE — Anesthesia Preprocedure Evaluation (Signed)
Anesthesia Evaluation  Patient identified by MRN, date of birth, ID band Patient awake    Reviewed: Allergy & Precautions, H&P , NPO status , Patient's Chart, lab work & pertinent test results, reviewed documented beta blocker date and time   Airway Mallampati: II  TM Distance: >3 FB Neck ROM: full    Dental no notable dental hx. (+) Upper Dentures, Lower Dentures   Pulmonary neg pulmonary ROS,    Pulmonary exam normal breath sounds clear to auscultation       Cardiovascular Exercise Tolerance: Good hypertension, On Medications negative cardio ROS   Rhythm:regular Rate:Normal     Neuro/Psych negative neurological ROS  negative psych ROS   GI/Hepatic negative GI ROS, Neg liver ROS,   Endo/Other  negative endocrine ROS  Renal/GU negative Renal ROS  negative genitourinary   Musculoskeletal   Abdominal   Peds  Hematology negative hematology ROS (+)   Anesthesia Other Findings   Reproductive/Obstetrics negative OB ROS                             Anesthesia Physical Anesthesia Plan  ASA: II  Anesthesia Plan: General   Post-op Pain Management:    Induction:   PONV Risk Score and Plan:   Airway Management Planned:   Additional Equipment:   Intra-op Plan:   Post-operative Plan:   Informed Consent: I have reviewed the patients History and Physical, chart, labs and discussed the procedure including the risks, benefits and alternatives for the proposed anesthesia with the patient or authorized representative who has indicated his/her understanding and acceptance.   Dental Advisory Given  Plan Discussed with: CRNA and Anesthesiologist  Anesthesia Plan Comments:         Anesthesia Quick Evaluation

## 2018-05-02 ENCOUNTER — Telehealth: Payer: Self-pay | Admitting: Orthopaedic Surgery

## 2018-05-02 DIAGNOSIS — M25562 Pain in left knee: Principal | ICD-10-CM

## 2018-05-02 DIAGNOSIS — G8929 Other chronic pain: Secondary | ICD-10-CM

## 2018-05-02 MED ORDER — HYDROCODONE-ACETAMINOPHEN 7.5-325 MG PO TABS: 1.0000 | ORAL_TABLET | Freq: Four times a day (QID) | ORAL | 0 refills | Status: DC | PRN

## 2018-05-02 NOTE — Telephone Encounter (Signed)
Patient called for refill:  -acetaminophen (NORCO) 7.5-325 MG tablet 40 tablet 0   - Megan Barber  - aware of appointment 05/29/18

## 2018-05-03 ENCOUNTER — Encounter (HOSPITAL_COMMUNITY): Payer: Self-pay | Admitting: Gastroenterology

## 2018-05-04 ENCOUNTER — Telehealth: Payer: Self-pay | Admitting: Gastroenterology

## 2018-05-04 DIAGNOSIS — M542 Cervicalgia: Secondary | ICD-10-CM | POA: Diagnosis not present

## 2018-05-04 DIAGNOSIS — R42 Dizziness and giddiness: Secondary | ICD-10-CM | POA: Diagnosis not present

## 2018-05-04 DIAGNOSIS — M546 Pain in thoracic spine: Secondary | ICD-10-CM | POA: Diagnosis not present

## 2018-05-04 DIAGNOSIS — M2560 Stiffness of unspecified joint, not elsewhere classified: Secondary | ICD-10-CM | POA: Diagnosis not present

## 2018-05-04 NOTE — Telephone Encounter (Signed)
Please call pt. She had three simple adenomas removed.    DRINK WATER TO KEEP YOUR URINE LIGHT YELLOW.  FOLLOW A HIGH FIBER DIET. AVOID ITEMS THAT CAUSE BLOATING & GAS.  CONTINUE YOUR WEIGHT LOSS EFFORTS.  A WEIGHT OF 185 LBS    WILL GET YOUR BODY MASS INDEX(BMI) UNDER 30.  Next colonoscopy in 3 years.

## 2018-05-07 NOTE — Telephone Encounter (Signed)
ON RECALL  °

## 2018-05-07 NOTE — Telephone Encounter (Signed)
Pt notified of results

## 2018-05-18 ENCOUNTER — Encounter: Payer: Self-pay | Admitting: Nurse Practitioner

## 2018-05-18 ENCOUNTER — Ambulatory Visit (INDEPENDENT_AMBULATORY_CARE_PROVIDER_SITE_OTHER): Payer: 59 | Admitting: Nurse Practitioner

## 2018-05-18 VITALS — BP 151/92 | HR 70 | Temp 97.2°F | Ht 65.0 in | Wt 204.0 lb

## 2018-05-18 DIAGNOSIS — K921 Melena: Secondary | ICD-10-CM

## 2018-05-18 DIAGNOSIS — Z86010 Personal history of colonic polyps: Secondary | ICD-10-CM | POA: Diagnosis not present

## 2018-05-18 DIAGNOSIS — K5900 Constipation, unspecified: Secondary | ICD-10-CM

## 2018-05-18 DIAGNOSIS — Z860101 Personal history of adenomatous and serrated colon polyps: Secondary | ICD-10-CM | POA: Insufficient documentation

## 2018-05-18 NOTE — Progress Notes (Signed)
Referring Provider: Jani Gravel, MD Primary Care Physician:  Jani Gravel, MD Primary GI:  Dr. Oneida Alar   Chief Complaint  Patient presents with  . Follow-up    s/p TSC    HPI:   Megan Barber is a 67 y.o. female who presents for follow-up office visit.  Patient was last seen in our office 02/12/2018 for constipation and hematochezia.  At that time she had been referred by primary care to schedule a colonoscopy.  She is doing well overall, never had a colonoscopy before.  Intermittent hematochezia with last episode about 1 to 2 months.  History of her bowels not moving well, hematochezia with generally scant toilet tissue hematochezia during constipation.  Intermittent GERD and she was on a PPI but stopped in favor of baking soda or Tums.  No other GI symptoms.  Recommended proceed with colonoscopy, Colace once daily, fiber supplement daily, follow-up in 3 months.  Colonoscopy was completed 05/01/2018 which found three 4 to 6 mm polyps in the proximal transverse colon and cecum, redundant left colon, no obvious source for hematochezia.  Surgical pathology found the polyps to be tubular adenoma.  Recommended repeat colonoscopy in 3 years.  Today she states she's doing well. She did have some post-procedure nausea but this self-resolved. Satisfied with the overall experience. Constipation is doing well on Colace and daily fiber as needed. Denies any further rectal bleeding. Denies abdominal pain, N/V, fever, chills, unintentional weight loss. Denies chest pain, dyspnea, dizziness, lightheadedness, syncope, near syncope. Denies any other upper or lower GI symptoms.  Past Medical History:  Diagnosis Date  . Hypertension   . Skin infection     Past Surgical History:  Procedure Laterality Date  . ABCESS DRAINAGE     suprapubic  . CHOLECYSTECTOMY    . COLONOSCOPY WITH PROPOFOL N/A 05/01/2018   Procedure: COLONOSCOPY WITH PROPOFOL;  Surgeon: Danie Binder, MD;  Location: [facility];  Service:  Endoscopy;  Laterality: N/A;  1:15pm  . INCISION AND DRAINAGE ABSCESS Right 01/30/2015   Procedure: INCISION AND DRAINAGE RIGHT AXILLARY ABSCESS;  Surgeon: Aviva Signs, MD;  Location: [facility];  Service: General;  Laterality: Right;  . POLYPECTOMY  05/01/2018   Procedure: POLYPECTOMY;  Surgeon: Danie Binder, MD;  Location: [facility];  Service: Endoscopy;;  cecal polyp, transverse polyps x2  . TUBAL LIGATION      Current Outpatient Medications  Medication Sig Dispense Refill  . acetaminophen (TYLENOL) 325 MG tablet Take 650 mg by mouth every 6 (six) hours as needed for pain.    Marland Kitchen amLODipine (NORVASC) 2.5 MG tablet Take 2.5 mg by mouth every evening.    Marland Kitchen HYDROcodone-acetaminophen (NORCO) 7.5-325 MG tablet Take 1 tablet by mouth every 6 (six) hours as needed for moderate pain (Must last 30 days). 40 tablet 0  . lisinopril-hydrochlorothiazide (PRINZIDE,ZESTORETIC) 10-12.5 MG per tablet Take 1 tablet by mouth daily.     . meclizine (ANTIVERT) 25 MG tablet Take 25 mg by mouth 4 (four) times daily as needed for dizziness.    Marland Kitchen zolpidem (AMBIEN) 10 MG tablet Take 10 mg by mouth at bedtime.      No current facility-administered medications for this visit.     Allergies as of 05/18/2018 - Review Complete 05/18/2018  Allergen Reaction Noted  . Darvocet [propoxyphene n-acetaminophen] Nausea And Vomiting 12/26/2011  . Toradol [ketorolac tromethamine] Nausea And Vomiting 12/26/2011    Family History  Problem Relation Age of Onset  . Hypertension Mother   .  Hypertension Father   . Hypertension Other   . Colon cancer Neg Hx     Social History   Socioeconomic History  . Marital status: Single    Spouse name: Not on file  . Number of children: Not on file  . Years of education: Not on file  . Highest education level: Not on file  Occupational History  . Not on file  Social Needs  . Financial resource strain: Not on file  . Food insecurity:    Worry: Not on file    Inability: Not on  file  . Transportation needs:    Medical: Not on file    Non-medical: Not on file  Tobacco Use  . Smoking status: Never Smoker  . Smokeless tobacco: Never Used  Substance and Sexual Activity  . Alcohol use: No  . Drug use: No  . Sexual activity: Never    Birth control/protection: None, Surgical  Lifestyle  . Physical activity:    Days per week: Not on file    Minutes per session: Not on file  . Stress: Not on file  Relationships  . Social connections:    Talks on phone: Not on file    Gets together: Not on file    Attends religious service: Not on file    Active member of club or organization: Not on file    Attends meetings of clubs or organizations: Not on file    Relationship status: Not on file  Other Topics Concern  . Not on file  Social History Narrative  . Not on file    Review of Systems: Complete ROS negative except as per HPI.   Physical Exam: BP (!) 151/92   Pulse 70   Temp (!) 97.2 F (36.2 C) (Oral)   Ht 5\' 5"  (1.651 m)   Wt 204 lb (92.5 kg)   BMI 33.95 kg/m  General:   Alert and oriented. Pleasant and cooperative. Well-nourished and well-developed.  Eyes:  Without icterus, sclera clear and conjunctiva pink.  Ears:  Normal auditory acuity. Cardiovascular:  S1, S2 present without murmurs appreciated. Extremities without clubbing or edema. Respiratory:  Clear to auscultation bilaterally. No wheezes, rales, or rhonchi. No distress.  Gastrointestinal:  +BS, soft, non-tender and non-distended. No HSM noted. No guarding or rebound. No masses appreciated.  Rectal:  Deferred  Musculoskalatal:  Symmetrical without gross deformities. Skin:  Intact without significant lesions or rashes. Neurologic:  Alert and oriented x4;  grossly normal neurologically. Psych:  Alert and cooperative. Normal mood and affect. Heme/Lymph/Immune: No excessive bruising noted.    05/18/2018 11:11 AM   Disclaimer: This note was dictated with voice recognition software. Similar  sounding words can inadvertently be transcribed and may not be corrected upon review.

## 2018-05-18 NOTE — Patient Instructions (Signed)
1. As we discussed, continue your current medications (especially daily colace and fiber supplement as needed) 2. Follow-up as needed for any GI concerns 3. Call us if you have any questions or concerns  At [facility] we value your feedback. You may receive a survey about your visit today. Please share your experience as we strive to create trusting relationships with our patients to provide genuine, compassionate, quality care.  We appreciate your understanding and patience as we review any laboratory studies, imaging, and other diagnostic tests that are ordered as we care for you. Our office policy is 5 business days for review of these results, and any emergent or urgent results are addressed in a timely manner for your best interest. If you do not hear from our office in 1 week, please contact us.   We also encourage the use of MyChart, which contains your medical information for your review as well. If you are not enrolled in this feature, an access code is on this after visit summary for your convenience. Thank you for allowing [place] to be involved in your care.  It was great to see you today!  I hope you have a great Fall!!

## 2018-05-18 NOTE — Assessment & Plan Note (Signed)
Patient was having scant toilet tissue hematochezia in the setting of constipation.  Colonoscopy was completed with no other explanation for rectal bleeding.  Since her constipation is been better controlled her rectal bleeding has resolved.  Recommend she call us as needed for any GI symptoms otherwise follow-up as needed.

## 2018-05-18 NOTE — Assessment & Plan Note (Signed)
The patient was previously experiencing significant constipation resulting in scant toilet tissue hematochezia.  Colonoscopy completed and results as per above.  Recommend she continue daily Colace, as needed fiber supplement.  Her constipation is much improved on this regimen.  Hematochezia has resolved since constipation improved.  Follow-up as needed.

## 2018-05-18 NOTE — Assessment & Plan Note (Signed)
The patient has had 3 tubular adenoma colon polyps removed recently.  Recommended repeat colonoscopy in 3 years.  She is on the recall list.

## 2018-05-21 NOTE — Progress Notes (Signed)
CC'D TO PCP °

## 2018-05-29 ENCOUNTER — Ambulatory Visit (INDEPENDENT_AMBULATORY_CARE_PROVIDER_SITE_OTHER): Payer: Medicare HMO | Admitting: Orthopaedic Surgery

## 2018-05-29 ENCOUNTER — Encounter: Payer: Self-pay | Admitting: Orthopaedic Surgery

## 2018-05-29 ENCOUNTER — Encounter: Payer: Self-pay | Admitting: Cardiology

## 2018-05-29 ENCOUNTER — Ambulatory Visit: Payer: Medicare HMO | Admitting: Cardiology

## 2018-05-29 VITALS — BP 110/74 | HR 67 | Ht 65.0 in | Wt 204.0 lb

## 2018-05-29 DIAGNOSIS — M25562 Pain in left knee: Secondary | ICD-10-CM | POA: Diagnosis not present

## 2018-05-29 DIAGNOSIS — G8929 Other chronic pain: Secondary | ICD-10-CM | POA: Diagnosis not present

## 2018-05-29 NOTE — Progress Notes (Signed)
Patient NK:NLZJ Megan Barber, female DOB:01-Dec-1950, 67 y.o. QBH:[id number]  Chief Complaint  Patient presents with  . Knee Pain    left    HPI  Megan Barber is a 67 y.o. female who has left knee pain. She has no giving way but has swelling and popping.  She has no new trauma. She is taking her medicine.   Body mass index is 33.95 kg/m.  ROS  Review of Systems  HENT: Negative for congestion.   Respiratory: Negative for cough and shortness of breath.   Cardiovascular: Negative for chest pain and leg swelling.       Hypertension  Endocrine: Positive for cold intolerance.  Musculoskeletal: Positive for arthralgias, gait problem and joint swelling (left knee chronic).  Allergic/Immunologic: Positive for environmental allergies.  All other systems reviewed and are negative.   All other systems reviewed and are negative.  The following is a summary of the past history medically, past history surgically, known current medicines, social history and family history.  This information is gathered electronically by the computer from prior information and documentation.  I review this each visit and have found including this information at this point in the chart is beneficial and informative.    Past Medical History:  Diagnosis Date  . Chronic back pain   . GERD (gastroesophageal reflux disease)   . Hypertension   . Osteoporosis   . Tubular adenoma    Colonoscopy October 2019 - Dr. Oneida Alar  . Vertigo     Past Surgical History:  Procedure Laterality Date  . ABCESS DRAINAGE     suprapubic  . CHOLECYSTECTOMY    . COLONOSCOPY WITH PROPOFOL N/A 05/01/2018   Procedure: COLONOSCOPY WITH PROPOFOL;  Surgeon: Danie Binder, MD;  [facility];  Service: Endoscopy;  Laterality: N/A;  1:15pm  . INCISION AND DRAINAGE ABSCESS Right 01/30/2015   Procedure: INCISION AND DRAINAGE RIGHT AXILLARY ABSCESS;  Surgeon: Aviva Signs, MD;  Location: [facility];  Service: General;  Laterality: Right;   . POLYPECTOMY  05/01/2018   Procedure: POLYPECTOMY;  Surgeon: Danie Binder, MD;  [facility]: [facility];  Service: Endoscopy;;  cecal polyp, transverse polyps x2  . TUBAL LIGATION      Family History  Problem Relation Age of Onset  . Hypertension Mother   . Colon cancer Mother   . Hypertension Father   . Hypertension Other     Social History Social History   Tobacco Use  . Smoking status: Never Smoker  . Smokeless tobacco: Never Used  Substance Use Topics  . Alcohol use: No  . Drug use: No    Allergies  Allergen Reactions  . Darvocet [Propoxyphene N-Acetaminophen] Nausea And Vomiting  . Toradol [Ketorolac Tromethamine] Nausea And Vomiting    Current Outpatient Medications  Medication Sig Dispense Refill  . acetaminophen (TYLENOL) 325 MG tablet Take 650 mg by mouth every 6 (six) hours as needed for pain.    Marland Kitchen amLODipine (NORVASC) 2.5 MG tablet Take 2.5 mg by mouth every evening.    Marland Kitchen HYDROcodone-acetaminophen (NORCO) 7.5-325 MG tablet Take 1 tablet by mouth every 6 (six) hours as needed for moderate pain (Must last 30 days). 40 tablet 0  . lisinopril-hydrochlorothiazide (PRINZIDE,ZESTORETIC) 10-12.5 MG per tablet Take 1 tablet by mouth daily.     . meclizine (ANTIVERT) 25 MG tablet Take 25 mg by mouth 4 (four) times daily as needed for dizziness.    Marland Kitchen zolpidem (AMBIEN) 10 MG tablet Take 10 mg by mouth  at bedtime.      No current facility-administered medications for this visit.      Physical Exam  Blood pressure 110/74, pulse 67, height 5\' 5"  (1.651 m), weight 204 lb (92.5 kg).  Constitutional: overall normal hygiene, normal nutrition, well developed, normal grooming, normal body habitus. Assistive device:none  Musculoskeletal: gait and station Limp left, muscle tone and strength are normal, no tremors or atrophy is present.  .  Neurological: coordination overall normal.  Deep tendon reflex/nerve stretch intact.  Sensation normal.  Cranial nerves II-XII intact.    Skin:   Normal overall no scars, lesions, ulcers or rashes. No psoriasis.  Psychiatric: Alert and oriented x 3.  Recent memory intact, remote memory unclear.  Normal mood and affect. Well groomed.  Good eye contact.  Cardiovascular: overall no swelling, no varicosities, no edema bilaterally, normal temperatures of the legs and arms, no clubbing, cyanosis and good capillary refill.  Lymphatic: palpation is normal.  The left lower extremity is examined:  Inspection:  Thigh:  Non-tender and no defects  Knee has swelling 1+ effusion.                        Joint tenderness is present                        Patient is tender over the medial joint line  Lower Leg:  Has normal appearance and no tenderness or defects  Ankle:  Non-tender and no defects  Foot:  Non-tender and no defects Range of Motion:  Knee:  Range of motion is: 0-110                        Crepitus is  present  Ankle:  Range of motion is normal. Strength and Tone:  The left lower extremity has normal strength and tone. Stability:  Knee:  The knee is stable.  Ankle:  The ankle is stable.   All other systems reviewed and are negative   The patient has been educated about the nature of the problem(s) and counseled on treatment options.  The patient appeared to understand what I have discussed and is in agreement with it.  Encounter Diagnosis  Name Primary?  . Chronic pain of left knee Yes    PLAN Call if any problems.  Precautions discussed.  Continue current medications.   Return to clinic 6 weeks   Electronically Signed Sanjuana Kava, MD 10/29/20199:44 AM

## 2018-05-29 NOTE — Progress Notes (Deleted)
Cardiology Office Note  Date: 05/29/2018   ID: Megan Barber, DOB September 04, 1950, MRN [id number]  PCP: Jani Gravel, MD  Consulting Cardiologist: Rozann Lesches, MD   No chief complaint on file.   History of Present Illness: Megan Barber is a 67 y.o. female referred for cardiology consultation by Dr. Maudie Mercury for the evaluation of chest pain.  Past Medical History:  Diagnosis Date  . Chronic back pain   . GERD (gastroesophageal reflux disease)   . Hypertension   . Osteoporosis   . Tubular adenoma    Colonoscopy October 2019 - Dr. Oneida Alar  . Vertigo     Past Surgical History:  Procedure Laterality Date  . ABCESS DRAINAGE     suprapubic  . CHOLECYSTECTOMY    . COLONOSCOPY WITH PROPOFOL N/A 05/01/2018   Procedure: COLONOSCOPY WITH PROPOFOL;  Surgeon: Danie Binder, MD;  [facility];  Service: Endoscopy;  Laterality: N/A;  1:15pm  . INCISION AND DRAINAGE ABSCESS Right 01/30/2015   Procedure: INCISION AND DRAINAGE RIGHT AXILLARY ABSCESS;  Surgeon: Aviva Signs, MD;  Location: [facility];  Service: General;  Laterality: Right;  . POLYPECTOMY  05/01/2018   Procedure: POLYPECTOMY;  Surgeon: Danie Binder, MD;  [facility];  Service: Endoscopy;;  cecal polyp, transverse polyps x2  . TUBAL LIGATION      Current Outpatient Medications  Medication Sig Dispense Refill  . acetaminophen (TYLENOL) 325 MG tablet Take 650 mg by mouth every 6 (six) hours as needed for pain.    Marland Kitchen amLODipine (NORVASC) 2.5 MG tablet Take 2.5 mg by mouth every evening.    Marland Kitchen HYDROcodone-acetaminophen (NORCO) 7.5-325 MG tablet Take 1 tablet by mouth every 6 (six) hours as needed for moderate pain (Must last 30 days). 40 tablet 0  . lisinopril-hydrochlorothiazide (PRINZIDE,ZESTORETIC) 10-12.5 MG per tablet Take 1 tablet by mouth daily.     . meclizine (ANTIVERT) 25 MG tablet Take 25 mg by mouth 4 (four) times daily as needed for dizziness.    Marland Kitchen zolpidem (AMBIEN) 10 MG tablet Take 10 mg by mouth at  bedtime.      No current facility-administered medications for this visit.    Allergies:  Darvocet [propoxyphene n-acetaminophen] and Toradol [ketorolac tromethamine]   Social History: The patient  reports that she has never smoked. She has never used smokeless tobacco. She reports that she does not drink alcohol or use drugs.   Family History: The patient's family history includes Colon cancer in her mother; Hypertension in her father, mother, and other.   ROS:  Please see the history of present illness. Otherwise, complete review of systems is positive for {NONE DEFAULTED:18576::"none"}.  All other systems are reviewed and negative.   Physical Exam: VS:  There were no vitals taken for this visit., BMI There is no height or weight on file to calculate BMI.  Wt Readings from Last 3 Encounters:  05/18/18 204 lb (92.5 kg)  04/09/18 190 lb (86.2 kg)  02/15/18 200 lb (90.7 kg)    General: Patient appears comfortable at rest. HEENT: Conjunctiva and lids normal, oropharynx clear with moist mucosa. Neck: Supple, no elevated JVP or carotid bruits, no thyromegaly. Lungs: Clear to auscultation, nonlabored breathing at rest. Cardiac: Regular rate and rhythm, no S3 or significant systolic murmur, no pericardial rub. Abdomen: Soft, nontender, no hepatomegaly, bowel sounds present, no guarding or rebound. Extremities: No pitting edema, distal pulses 2+. Skin: Warm and dry. Musculoskeletal: No kyphosis. Neuropsychiatric: Alert and oriented x3, affect  grossly appropriate.  ECG: I personally reviewed the tracing from 04/09/2018 which showed normal sinus rhythm.  Recent Labwork: 04/09/2018: ALT 10; AST 18; BUN 10; Creatinine, Ser 0.91; Hemoglobin 12.6; Platelets 235; Potassium 3.4; Sodium 141   Other Studies Reviewed Today:  Chest x-ray 04/09/2018: FINDINGS: Minimal atelectasis at the left base. No focal consolidation or pleural effusion. Normal heart size. No pneumothorax.  IMPRESSION: No  active cardiopulmonary disease.  Assessment and Plan:   Current medicines were reviewed with the patient today.  No orders of the defined types were placed in this encounter.   Disposition:  Signed, Satira Sark, MD, [facility] 05/29/2018 8:13 AM    [facility] [place]. [address] [place], [place] [address] Phone: [phone]; Fax: [phone]

## 2018-05-30 ENCOUNTER — Encounter: Payer: Self-pay | Admitting: Cardiology

## 2018-06-04 ENCOUNTER — Telehealth: Payer: Self-pay | Admitting: Orthopaedic Surgery

## 2018-06-04 DIAGNOSIS — G8929 Other chronic pain: Secondary | ICD-10-CM

## 2018-06-04 DIAGNOSIS — M25562 Pain in left knee: Principal | ICD-10-CM

## 2018-06-04 NOTE — Telephone Encounter (Signed)
Hydrocodone-Acetaminophen  7.5/325 mg  Qty 40 Tablets ° °PATIENT USES [facility] °

## 2018-06-05 MED ORDER — HYDROCODONE-ACETAMINOPHEN 7.5-325 MG PO TABS: 1.0000 | ORAL_TABLET | Freq: Four times a day (QID) | ORAL | 0 refills | Status: DC | PRN

## 2018-07-05 ENCOUNTER — Ambulatory Visit (INDEPENDENT_AMBULATORY_CARE_PROVIDER_SITE_OTHER): Payer: Medicare HMO | Admitting: Orthopaedic Surgery

## 2018-07-05 ENCOUNTER — Encounter: Payer: Self-pay | Admitting: Orthopaedic Surgery

## 2018-07-05 VITALS — Ht 65.0 in | Wt 204.0 lb

## 2018-07-05 DIAGNOSIS — M25562 Pain in left knee: Secondary | ICD-10-CM

## 2018-07-05 DIAGNOSIS — G8929 Other chronic pain: Secondary | ICD-10-CM | POA: Diagnosis not present

## 2018-07-05 MED ORDER — HYDROCODONE-ACETAMINOPHEN 7.5-325 MG PO TABS: 1.0000 | ORAL_TABLET | Freq: Four times a day (QID) | ORAL | 0 refills | Status: DC | PRN

## 2018-07-05 NOTE — Progress Notes (Signed)
CC:  I have pain of my left knee. I would like an injection.  The patient has chronic pain of the left knee.  There is no recent trauma.  There is no redness.  Injections in the past have helped.  The knee has no redness, has an effusion and crepitus present.  ROM of the left knee is 0-105.  Impression:  Chronic knee pain left  Return: 1 month  PROCEDURE NOTE:  The patient requests injections of the left knee, verbal consent was obtained.  The left knee was prepped appropriately after time out was performed.   Sterile technique was observed and injection of 1 cc of Depo-Medrol 40 mg with several cc's of plain xylocaine. Anesthesia was provided by ethyl chloride and a 20-gauge needle was used to inject the knee area. The injection was tolerated well.  A band aid dressing was applied.  The patient was advised to apply ice later today and tomorrow to the injection sight as needed.  I have reviewed the [place] web site prior to prescribing narcotic medicine for this patient.   Electronically Signed Sanjuana Kava, MD 12/5/20199:26 AM

## 2018-07-10 ENCOUNTER — Ambulatory Visit: Payer: Medicare HMO | Admitting: Orthopaedic Surgery

## 2018-08-02 ENCOUNTER — Encounter: Payer: Self-pay | Admitting: Orthopaedic Surgery

## 2018-08-02 ENCOUNTER — Ambulatory Visit (INDEPENDENT_AMBULATORY_CARE_PROVIDER_SITE_OTHER): Payer: Medicare HMO | Admitting: Orthopaedic Surgery

## 2018-08-02 DIAGNOSIS — M25562 Pain in left knee: Secondary | ICD-10-CM

## 2018-08-02 DIAGNOSIS — G8929 Other chronic pain: Secondary | ICD-10-CM | POA: Diagnosis not present

## 2018-08-02 MED ORDER — HYDROCODONE-ACETAMINOPHEN 7.5-325 MG PO TABS: 1.0000 | ORAL_TABLET | Freq: Four times a day (QID) | ORAL | 0 refills | Status: DC | PRN

## 2018-08-02 NOTE — Progress Notes (Signed)
CC:  I have pain of my left knee. I would like an injection.  The patient has chronic pain of the left knee.  There is no recent trauma.  There is no redness.  Injections in the past have helped.  The knee has no redness, has an effusion and crepitus present.  ROM of the left knee is 0-110.  Impression:  Chronic knee pain left  Return: 1 month  PROCEDURE NOTE:  The patient requests injections of the left knee, verbal consent was obtained.  The left knee was prepped appropriately after time out was performed.   Sterile technique was observed and injection of 1 cc of Depo-Medrol 40 mg with several cc's of plain xylocaine. Anesthesia was provided by ethyl chloride and a 20-gauge needle was used to inject the knee area. The injection was tolerated well.  A band aid dressing was applied.  The patient was advised to apply ice later today and tomorrow to the injection sight as needed.  I have reviewed the [place] web site prior to prescribing narcotic medicine for this patient.    Electronically Signed Sanjuana Kava, MD 1/2/20209:42 AM

## 2018-08-30 ENCOUNTER — Ambulatory Visit (INDEPENDENT_AMBULATORY_CARE_PROVIDER_SITE_OTHER): Payer: Medicare HMO | Admitting: Orthopaedic Surgery

## 2018-08-30 ENCOUNTER — Encounter: Payer: Self-pay | Admitting: Orthopaedic Surgery

## 2018-08-30 VITALS — BP 142/80 | HR 90 | Ht 65.0 in | Wt 204.0 lb

## 2018-08-30 DIAGNOSIS — G8929 Other chronic pain: Secondary | ICD-10-CM | POA: Diagnosis not present

## 2018-08-30 DIAGNOSIS — M25562 Pain in left knee: Secondary | ICD-10-CM | POA: Diagnosis not present

## 2018-08-30 MED ORDER — HYDROCODONE-ACETAMINOPHEN 7.5-325 MG PO TABS: 1.0000 | ORAL_TABLET | Freq: Four times a day (QID) | ORAL | 0 refills | Status: DC | PRN

## 2018-08-30 NOTE — Progress Notes (Signed)
CC:  My knee is so much better  Her left knee is not hurting today. She has little pain. She is walking well.   NV intact. ROM 0 to 110.  Encounter Diagnosis  Name Primary?  . Chronic pain of left knee Yes   I have reviewed the [place] web site prior to prescribing narcotic medicine for this patient.   I will see her as needed.  Electronically Signed Sanjuana Kava, MD 1/30/20209:41 AM

## 2018-10-01 ENCOUNTER — Telehealth: Payer: Self-pay | Admitting: Orthopaedic Surgery

## 2018-10-01 DIAGNOSIS — G8929 Other chronic pain: Secondary | ICD-10-CM

## 2018-10-01 DIAGNOSIS — M25562 Pain in left knee: Principal | ICD-10-CM

## 2018-10-01 NOTE — Telephone Encounter (Signed)
Patient requests refill on Hydrocodone/Acetaminophen 7.5-325  Mgs.  Qty  40  Sig: Take 1 tablet by mouth every 6 (six) hours as needed for moderate pain (Must last 30 days).  Patient states she uses [facility] 

## 2018-10-02 MED ORDER — HYDROCODONE-ACETAMINOPHEN 7.5-325 MG PO TABS: 1.0000 | ORAL_TABLET | Freq: Four times a day (QID) | ORAL | 0 refills | Status: DC | PRN

## 2018-10-03 ENCOUNTER — Ambulatory Visit (INDEPENDENT_AMBULATORY_CARE_PROVIDER_SITE_OTHER): Payer: Medicare HMO | Admitting: Orthopaedic Surgery

## 2018-10-03 ENCOUNTER — Encounter: Payer: Self-pay | Admitting: Orthopaedic Surgery

## 2018-10-03 VITALS — BP 100/64 | HR 62 | Ht 65.0 in | Wt 202.0 lb

## 2018-10-03 DIAGNOSIS — M25562 Pain in left knee: Secondary | ICD-10-CM | POA: Diagnosis not present

## 2018-10-03 DIAGNOSIS — G8929 Other chronic pain: Secondary | ICD-10-CM

## 2018-10-03 DIAGNOSIS — G4700 Insomnia, unspecified: Secondary | ICD-10-CM | POA: Diagnosis not present

## 2018-10-03 DIAGNOSIS — I10 Essential (primary) hypertension: Secondary | ICD-10-CM

## 2018-10-03 NOTE — Progress Notes (Signed)
CC:  I have pain of my left knee. I would like an injection.  The patient has chronic pain of the left knee.  There is no recent trauma.  There is no redness.  Injections in the past have helped.  The knee has no redness, has an effusion and crepitus present.  ROM of the left knee is 0-105.  Impression:  Chronic knee pain left  Return: 1 month  PROCEDURE NOTE:  The patient requests injections of the left knee, verbal consent was obtained.  The left knee was prepped appropriately after time out was performed.   Sterile technique was observed and injection of 1 cc of Depo-Medrol 40 mg with several cc's of plain xylocaine. Anesthesia was provided by ethyl chloride and a 20-gauge needle was used to inject the knee area. The injection was tolerated well.  A band aid dressing was applied.  The patient was advised to apply ice later today and tomorrow to the injection sight as needed.  I have reviewed the [place] web site prior to prescribing narcotic medicine for this patient.   Electronically Signed Sanjuana Kava, MD 3/4/20209:53 AM

## 2018-10-30 ENCOUNTER — Telehealth (INDEPENDENT_AMBULATORY_CARE_PROVIDER_SITE_OTHER): Payer: Self-pay | Admitting: Radiology

## 2018-10-30 DIAGNOSIS — G8929 Other chronic pain: Secondary | ICD-10-CM

## 2018-10-30 DIAGNOSIS — M25562 Pain in left knee: Principal | ICD-10-CM

## 2018-10-30 NOTE — Telephone Encounter (Signed)
Patient rescheduled her appt four weeks out, she will call if she needs to come in for injection, and cannot wait til then.   She is asking for a refill of hydrocodone be sent to her pharmacy.

## 2018-10-31 ENCOUNTER — Telehealth: Payer: Self-pay | Admitting: Orthopaedic Surgery

## 2018-10-31 NOTE — Telephone Encounter (Signed)
Patient requests refill on Hydrocodone/Acetaminophen 7.5-325  Mgs.  Qty  40  Sig: Take 1 tablet by mouth every 6 (six) hours as needed for moderate pain (Must last 30 days).        Patient states she uses [facility]

## 2018-11-01 ENCOUNTER — Ambulatory Visit: Payer: Medicare HMO | Admitting: Orthopaedic Surgery

## 2018-11-01 MED ORDER — HYDROCODONE-ACETAMINOPHEN 7.5-325 MG PO TABS: 1.0000 | ORAL_TABLET | Freq: Four times a day (QID) | ORAL | 0 refills | Status: DC | PRN

## 2018-11-01 NOTE — Addendum Note (Signed)
Addended by: Willette Pa on: 11/01/2018 09:58 AM   Modules accepted: Orders

## 2018-11-20 DIAGNOSIS — E559 Vitamin D deficiency, unspecified: Secondary | ICD-10-CM | POA: Diagnosis not present

## 2018-11-20 DIAGNOSIS — I10 Essential (primary) hypertension: Secondary | ICD-10-CM | POA: Diagnosis not present

## 2018-11-20 DIAGNOSIS — Z79899 Other long term (current) drug therapy: Secondary | ICD-10-CM | POA: Diagnosis not present

## 2018-11-20 DIAGNOSIS — M25562 Pain in left knee: Secondary | ICD-10-CM | POA: Diagnosis not present

## 2018-11-20 DIAGNOSIS — R5383 Other fatigue: Secondary | ICD-10-CM | POA: Diagnosis not present

## 2018-11-20 DIAGNOSIS — E7849 Other hyperlipidemia: Secondary | ICD-10-CM | POA: Diagnosis not present

## 2018-11-20 DIAGNOSIS — Z0000 Encounter for general adult medical examination without abnormal findings: Secondary | ICD-10-CM | POA: Diagnosis not present

## 2018-11-20 DIAGNOSIS — G4700 Insomnia, unspecified: Secondary | ICD-10-CM | POA: Diagnosis not present

## 2018-11-20 DIAGNOSIS — M810 Age-related osteoporosis without current pathological fracture: Secondary | ICD-10-CM | POA: Diagnosis not present

## 2018-11-29 ENCOUNTER — Ambulatory Visit (INDEPENDENT_AMBULATORY_CARE_PROVIDER_SITE_OTHER): Payer: Medicare HMO | Admitting: Orthopaedic Surgery

## 2018-11-29 ENCOUNTER — Encounter: Payer: Self-pay | Admitting: Orthopaedic Surgery

## 2018-11-29 ENCOUNTER — Other Ambulatory Visit: Payer: Self-pay

## 2018-11-29 VITALS — Temp 96.6°F | Ht 65.0 in | Wt 202.0 lb

## 2018-11-29 DIAGNOSIS — M25562 Pain in left knee: Secondary | ICD-10-CM

## 2018-11-29 DIAGNOSIS — G8929 Other chronic pain: Secondary | ICD-10-CM | POA: Diagnosis not present

## 2018-11-29 MED ORDER — HYDROCODONE-ACETAMINOPHEN 7.5-325 MG PO TABS: 1.0000 | ORAL_TABLET | Freq: Four times a day (QID) | ORAL | 0 refills | Status: DC | PRN

## 2018-11-29 NOTE — Progress Notes (Signed)
CC:  I have pain of my left knee. I would like an injection.  The patient has chronic pain of the left knee.  There is no recent trauma.  There is no redness.  Injections in the past have helped.  The knee has no redness, has an effusion and crepitus present.  ROM of the left knee is 0-110.  Impression:  Chronic knee pain left  Return: as needed  PROCEDURE NOTE:  The patient requests injections of the left knee, verbal consent was obtained.  The left knee was prepped appropriately after time out was performed.   Sterile technique was observed and injection of 1 cc of Depo-Medrol 40 mg with several cc's of plain xylocaine. Anesthesia was provided by ethyl chloride and a 20-gauge needle was used to inject the knee area. The injection was tolerated well.  A band aid dressing was applied.  The patient was advised to apply ice later today and tomorrow to the injection sight as needed.  I have reviewed the [place] web site prior to prescribing narcotic medicine for this patient.   Electronically Signed Sanjuana Kava, MD 4/30/20209:15 AM

## 2018-12-27 ENCOUNTER — Telehealth: Payer: Self-pay | Admitting: Orthopaedic Surgery

## 2018-12-27 DIAGNOSIS — G8929 Other chronic pain: Secondary | ICD-10-CM

## 2018-12-27 MED ORDER — HYDROCODONE-ACETAMINOPHEN 7.5-325 MG PO TABS: 1.0000 | ORAL_TABLET | Freq: Four times a day (QID) | ORAL | 0 refills | Status: DC | PRN

## 2018-12-27 NOTE — Telephone Encounter (Signed)
Hydrocodone-Acetaminophen  7.5/325 mg  Qty 40 Tablets  PATIENT USES [facility]

## 2019-01-29 ENCOUNTER — Other Ambulatory Visit: Payer: Self-pay

## 2019-01-29 ENCOUNTER — Ambulatory Visit (INDEPENDENT_AMBULATORY_CARE_PROVIDER_SITE_OTHER): Payer: Medicare HMO | Admitting: Orthopaedic Surgery

## 2019-01-29 ENCOUNTER — Telehealth: Payer: Self-pay | Admitting: Orthopaedic Surgery

## 2019-01-29 ENCOUNTER — Encounter: Payer: Self-pay | Admitting: Orthopaedic Surgery

## 2019-01-29 VITALS — Temp 98.0°F

## 2019-01-29 DIAGNOSIS — M25562 Pain in left knee: Secondary | ICD-10-CM | POA: Diagnosis not present

## 2019-01-29 DIAGNOSIS — G8929 Other chronic pain: Secondary | ICD-10-CM

## 2019-01-29 MED ORDER — HYDROCODONE-ACETAMINOPHEN 7.5-325 MG PO TABS: 1.0000 | ORAL_TABLET | Freq: Four times a day (QID) | ORAL | 0 refills | Status: DC | PRN

## 2019-01-29 NOTE — Progress Notes (Signed)
CC:  I have pain of my left knee. I would like an injection.  The patient has chronic pain of the left knee.  There is no recent trauma.  There is no redness.  Injections in the past have helped.  The knee has no redness, has an effusion and crepitus present.  ROM of the left knee is 0-110.  Impression:  Chronic knee pain left  Return: as needed  PROCEDURE NOTE:  The patient requests injections of the left knee, verbal consent was obtained.  The left knee was prepped appropriately after time out was performed.   Sterile technique was observed and injection of 1 cc of Depo-Medrol 40 mg with several cc's of plain xylocaine. Anesthesia was provided by ethyl chloride and a 20-gauge needle was used to inject the knee area. The injection was tolerated well.  A band aid dressing was applied.  The patient was advised to apply ice later today and tomorrow to the injection sight as needed.  Electronically Signed Sanjuana Kava, MD 6/30/202010:14 AM

## 2019-01-29 NOTE — Telephone Encounter (Signed)
Patient was seen today but her pain medication was not discussed.  She requests refill on Hydrocodone/Acetaminophen 7.5-325  Mgs.  Qty  40  Sig: Take 1 tablet by mouth every 6 (six) hours as needed for moderate pain (Must last 30 days).  Patient uses [facility]

## 2019-02-26 ENCOUNTER — Telehealth: Payer: Self-pay | Admitting: Orthopaedic Surgery

## 2019-02-26 DIAGNOSIS — M25562 Pain in left knee: Secondary | ICD-10-CM

## 2019-02-26 DIAGNOSIS — G8929 Other chronic pain: Secondary | ICD-10-CM

## 2019-02-26 MED ORDER — HYDROCODONE-ACETAMINOPHEN 7.5-325 MG PO TABS: 1.0000 | ORAL_TABLET | Freq: Four times a day (QID) | ORAL | 0 refills | Status: DC | PRN

## 2019-02-26 NOTE — Telephone Encounter (Signed)
Patient called for refill: HYDROcodone-acetaminophen (NORCO) 7.5-325 MG tablet 40 tablet   - Megan Barber    - last visit note indicates follow up as needed. No appointment scheduled as of yet.

## 2019-03-21 ENCOUNTER — Ambulatory Visit (INDEPENDENT_AMBULATORY_CARE_PROVIDER_SITE_OTHER): Payer: Medicare HMO | Admitting: Orthopaedic Surgery

## 2019-03-21 ENCOUNTER — Encounter: Payer: Self-pay | Admitting: Orthopaedic Surgery

## 2019-03-21 ENCOUNTER — Other Ambulatory Visit: Payer: Self-pay

## 2019-03-21 VITALS — BP 126/64 | HR 63 | Temp 96.8°F | Ht 65.0 in | Wt 205.2 lb

## 2019-03-21 DIAGNOSIS — G8929 Other chronic pain: Secondary | ICD-10-CM | POA: Diagnosis not present

## 2019-03-21 DIAGNOSIS — M25562 Pain in left knee: Secondary | ICD-10-CM | POA: Diagnosis not present

## 2019-03-21 MED ORDER — HYDROCODONE-ACETAMINOPHEN 7.5-325 MG PO TABS: 1.0000 | ORAL_TABLET | Freq: Four times a day (QID) | ORAL | 0 refills | Status: DC | PRN

## 2019-03-21 NOTE — Progress Notes (Signed)
CC:  I have pain of my left knee. I would like an injection.  The patient has chronic pain of the left knee.  There is no recent trauma.  There is no redness.  Injections in the past have helped.  The knee has no redness, has an effusion and crepitus present.  ROM of the left knee is 0-105.  Impression:  Chronic knee pain left  Return: 2 months  PROCEDURE NOTE:  The patient requests injections of the left knee, verbal consent was obtained.  The left knee was prepped appropriately after time out was performed.   Sterile technique was observed and injection of 1 cc of Depo-Medrol 40 mg with several cc's of plain xylocaine. Anesthesia was provided by ethyl chloride and a 20-gauge needle was used to inject the knee area. The injection was tolerated well.  A band aid dressing was applied.  The patient was advised to apply ice later today and tomorrow to the injection sight as needed.  I have reviewed the [place] web site prior to prescribing narcotic medicine for this patient.   Electronically Signed Sanjuana Kava, MD 8/20/20208:17 AM

## 2019-04-25 ENCOUNTER — Other Ambulatory Visit: Payer: Self-pay

## 2019-04-25 DIAGNOSIS — M25562 Pain in left knee: Secondary | ICD-10-CM

## 2019-04-25 DIAGNOSIS — G8929 Other chronic pain: Secondary | ICD-10-CM

## 2019-04-25 MED ORDER — HYDROCODONE-ACETAMINOPHEN 7.5-325 MG PO TABS: 1.0000 | ORAL_TABLET | Freq: Four times a day (QID) | ORAL | 0 refills | Status: DC | PRN

## 2019-04-25 NOTE — Telephone Encounter (Signed)
Dr. Brooke Bonito pt  Hydrocodone-Acetaminophen 7.5/325mg   Qty 40 Tablets  Take 1 tablet by mouth every 6 (six) hours as needed for moderate pain (Must last 30 days).  PATIENT USES [facility]

## 2019-05-21 ENCOUNTER — Ambulatory Visit (INDEPENDENT_AMBULATORY_CARE_PROVIDER_SITE_OTHER): Payer: Medicare HMO | Admitting: Orthopaedic Surgery

## 2019-05-21 ENCOUNTER — Other Ambulatory Visit: Payer: Self-pay

## 2019-05-21 ENCOUNTER — Encounter: Payer: Self-pay | Admitting: Orthopaedic Surgery

## 2019-05-21 DIAGNOSIS — I10 Essential (primary) hypertension: Secondary | ICD-10-CM | POA: Diagnosis not present

## 2019-05-21 DIAGNOSIS — M25562 Pain in left knee: Secondary | ICD-10-CM | POA: Diagnosis not present

## 2019-05-21 DIAGNOSIS — G8929 Other chronic pain: Secondary | ICD-10-CM

## 2019-05-21 MED ORDER — HYDROCODONE-ACETAMINOPHEN 7.5-325 MG PO TABS: 1.0000 | ORAL_TABLET | Freq: Four times a day (QID) | ORAL | 0 refills | Status: DC | PRN

## 2019-05-21 NOTE — Progress Notes (Signed)
CC:  I have pain of my left knee. I would like an injection.  The patient has chronic pain of the left knee.  There is no recent trauma.  There is no redness.  Injections in the past have helped.  The knee has no redness, has an effusion and crepitus present.  ROM of the left knee is 0-110.  Impression:  Chronic knee pain left  Return: 2 months  PROCEDURE NOTE:  The patient requests injections of the left knee, verbal consent was obtained.  The left knee was prepped appropriately after time out was performed.   Sterile technique was observed and injection of 1 cc of Depo-Medrol 40 mg with several cc's of plain xylocaine. Anesthesia was provided by ethyl chloride and a 20-gauge needle was used to inject the knee area. The injection was tolerated well.  A band aid dressing was applied.  The patient was advised to apply ice later today and tomorrow to the injection sight as needed.  I have reviewed the [place] web site prior to prescribing narcotic medicine for this patient.   Electronically Signed Sanjuana Kava, MD 10/20/20209:07 AM

## 2019-05-22 ENCOUNTER — Emergency Department (HOSPITAL_COMMUNITY): Payer: Medicare HMO

## 2019-05-22 ENCOUNTER — Other Ambulatory Visit: Payer: Self-pay

## 2019-05-22 ENCOUNTER — Emergency Department (HOSPITAL_COMMUNITY)
Admission: EM | Admit: 2019-05-22 | Discharge: 2019-05-22 | Disposition: A | Discharge status: Home / Self Care | Payer: Medicare HMO | Attending: Emergency Medicine | Admitting: Emergency Medicine

## 2019-05-22 ENCOUNTER — Encounter (HOSPITAL_COMMUNITY): Payer: Self-pay

## 2019-05-22 DIAGNOSIS — I10 Essential (primary) hypertension: Secondary | ICD-10-CM | POA: Diagnosis not present

## 2019-05-22 DIAGNOSIS — R079 Chest pain, unspecified: Secondary | ICD-10-CM | POA: Diagnosis not present

## 2019-05-22 DIAGNOSIS — Y9389 Activity, other specified: Secondary | ICD-10-CM | POA: Insufficient documentation

## 2019-05-22 DIAGNOSIS — Z79899 Other long term (current) drug therapy: Secondary | ICD-10-CM | POA: Diagnosis not present

## 2019-05-22 DIAGNOSIS — Y92410 Unspecified street and highway as the place of occurrence of the external cause: Secondary | ICD-10-CM | POA: Diagnosis not present

## 2019-05-22 DIAGNOSIS — S20219A Contusion of unspecified front wall of thorax, initial encounter: Secondary | ICD-10-CM | POA: Diagnosis not present

## 2019-05-22 DIAGNOSIS — Y999 Unspecified external cause status: Secondary | ICD-10-CM | POA: Insufficient documentation

## 2019-05-22 DIAGNOSIS — S299XXA Unspecified injury of thorax, initial encounter: Secondary | ICD-10-CM | POA: Diagnosis present

## 2019-05-22 DIAGNOSIS — S8992XA Unspecified injury of left lower leg, initial encounter: Secondary | ICD-10-CM | POA: Diagnosis not present

## 2019-05-22 DIAGNOSIS — M25562 Pain in left knee: Secondary | ICD-10-CM | POA: Diagnosis not present

## 2019-05-22 DIAGNOSIS — S86912A Strain of unspecified muscle(s) and tendon(s) at lower leg level, left leg, initial encounter: Secondary | ICD-10-CM

## 2019-05-22 MED ORDER — ACETAMINOPHEN 325 MG PO TABS
650.0000 mg | ORAL_TABLET | ORAL | Status: DC | PRN
  Administered 2019-05-22: 650 mg via ORAL
  Filled 2019-05-22: qty 2

## 2019-05-22 MED ORDER — METHOCARBAMOL 500 MG PO TABS
500.0000 mg | ORAL_TABLET | Freq: Once | ORAL | Status: AC
  Administered 2019-05-22: 500 mg via ORAL
  Filled 2019-05-22: qty 1

## 2019-05-22 MED ORDER — METHOCARBAMOL 500 MG PO TABS: 500.0000 mg | ORAL_TABLET | Freq: Three times a day (TID) | ORAL | 0 refills | Status: AC | PRN
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
## 2019-05-23 DIAGNOSIS — E559 Vitamin D deficiency, unspecified: Secondary | ICD-10-CM | POA: Diagnosis not present

## 2019-05-23 DIAGNOSIS — G4700 Insomnia, unspecified: Secondary | ICD-10-CM | POA: Diagnosis not present

## 2019-05-23 DIAGNOSIS — I10 Essential (primary) hypertension: Secondary | ICD-10-CM | POA: Diagnosis not present

## 2019-05-29 ENCOUNTER — Telehealth: Payer: Self-pay | Admitting: Orthopaedic Surgery
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
## 2019-05-30 ENCOUNTER — Ambulatory Visit: Payer: Medicare HMO | Admitting: Orthopaedic Surgery

## 2019-06-04 ENCOUNTER — Encounter: Payer: Self-pay | Admitting: Orthopaedic Surgery

## 2019-06-04 ENCOUNTER — Other Ambulatory Visit: Payer: Self-pay

## 2019-06-04 ENCOUNTER — Ambulatory Visit (INDEPENDENT_AMBULATORY_CARE_PROVIDER_SITE_OTHER): Payer: Medicare HMO | Admitting: Orthopaedic Surgery

## 2019-06-04 VITALS — BP 152/102 | HR 80 | Ht 65.0 in | Wt 205.0 lb

## 2019-06-04 DIAGNOSIS — M25552 Pain in left hip: Secondary | ICD-10-CM | POA: Diagnosis not present

## 2019-06-04 DIAGNOSIS — M25562 Pain in left knee: Secondary | ICD-10-CM

## 2019-06-04 DIAGNOSIS — G8929 Other chronic pain: Secondary | ICD-10-CM

## 2019-06-04 MED ORDER — HYDROCODONE-ACETAMINOPHEN 7.5-325 MG PO TABS: 1.0000 | ORAL_TABLET | Freq: Four times a day (QID) | ORAL | 0 refills | Status: DC | PRN
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
## 2019-06-10 DIAGNOSIS — H5200 Hypermetropia, unspecified eye: Secondary | ICD-10-CM | POA: Diagnosis not present

## 2019-06-18 ENCOUNTER — Ambulatory Visit (INDEPENDENT_AMBULATORY_CARE_PROVIDER_SITE_OTHER): Payer: Medicare HMO | Admitting: Orthopaedic Surgery

## 2019-06-18 ENCOUNTER — Encounter: Payer: Self-pay | Admitting: Orthopaedic Surgery

## 2019-06-18 ENCOUNTER — Other Ambulatory Visit: Payer: Self-pay

## 2019-06-18 VITALS — BP 167/101 | HR 75 | Temp 97.2°F | Ht 65.0 in | Wt 214.4 lb

## 2019-06-18 DIAGNOSIS — G8929 Other chronic pain: Secondary | ICD-10-CM

## 2019-06-18 DIAGNOSIS — M25552 Pain in left hip: Secondary | ICD-10-CM

## 2019-06-18 DIAGNOSIS — M25562 Pain in left knee: Secondary | ICD-10-CM | POA: Diagnosis not present

## 2019-06-18 NOTE — Progress Notes (Signed)
Patient Megan Barber, female DOB:1951/01/15, 68 y.o. [id number]  Chief Complaint  Patient presents with  . Hip Pain    L/hurting but not as bad  . Knee Pain    L/hurting but not as bad    HPI  Megan Barber is a 68 y.o. female who has pain in the left knee and left hip.  She is a little better. She still has swelling and popping of the left knee but it is not as tender as it has been. She has no giving way or locking. She has pain of the lateral hip over the trochanter on the left. She is using rubs and her medicine. She has no new trauma.   Body mass index is 35.67 kg/m.  ROS  Review of Systems  HENT: Negative for congestion.   Respiratory: Negative for cough and shortness of breath.   Cardiovascular: Negative for chest pain and leg swelling.       Hypertension  Endocrine: Positive for cold intolerance.  Musculoskeletal: Positive for arthralgias, gait problem and joint swelling (left knee chronic).  Allergic/Immunologic: Positive for environmental allergies.  All other systems reviewed and are negative.   All other systems reviewed and are negative.  The following is a summary of the past history medically, past history surgically, known current medicines, social history and family history.  This information is gathered electronically by the computer from prior information and documentation.  I review this each visit and have found including this information at this point in the chart is beneficial and informative.    Past Medical History:  Diagnosis Date  . Chronic back pain   . GERD (gastroesophageal reflux disease)   . Hypertension   . Osteoporosis   . Tubular adenoma    Colonoscopy October 2019 - Dr. Oneida Alar  . Vertigo     Past Surgical History:  Procedure Laterality Date  . ABCESS DRAINAGE     suprapubic  . CHOLECYSTECTOMY    . COLONOSCOPY WITH PROPOFOL N/A 05/01/2018   Procedure: COLONOSCOPY WITH PROPOFOL;  Surgeon: Danie Binder, MD;  Location: [facility];  Service: Endoscopy;  Laterality: N/A;  1:15pm  . INCISION AND DRAINAGE ABSCESS Right 01/30/2015   Procedure: INCISION AND DRAINAGE RIGHT AXILLARY ABSCESS;  Surgeon: Aviva Signs, MD;  Location: [facility];  Service: General;  Laterality: Right;  . POLYPECTOMY  05/01/2018   Procedure: POLYPECTOMY;  Surgeon: Danie Binder, MD;  Location: [facility];  Service: Endoscopy;;  cecal polyp, transverse polyps x2  . TUBAL LIGATION      Family History  Problem Relation Age of Onset  . Hypertension Mother   . Colon cancer Mother   . Hypertension Father   . Hypertension Other     Social History Social History   Tobacco Use  . Smoking status: Never Smoker  . Smokeless tobacco: Never Used  Substance Use Topics  . Alcohol use: No  . Drug use: No    Allergies  Allergen Reactions  . Darvocet [Propoxyphene N-Acetaminophen] Nausea And Vomiting  . Toradol [Ketorolac Tromethamine] Nausea And Vomiting    Current Outpatient Medications  Medication Sig Dispense Refill  . acetaminophen (TYLENOL) 325 MG tablet Take 650 mg by mouth every 6 (six) hours as needed for pain.    Marland Kitchen amLODipine (NORVASC) 2.5 MG tablet Take 2.5 mg by mouth every evening.    . hydrochlorothiazide (MICROZIDE) 12.5 MG capsule     . HYDROcodone-acetaminophen (NORCO) 7.5-325 MG tablet Take 1 tablet by  mouth every 6 (six) hours as needed for moderate pain (Must last 30 days). 40 tablet 0  . lisinopril (ZESTRIL) 10 MG tablet     . lisinopril-hydrochlorothiazide (PRINZIDE,ZESTORETIC) 10-12.5 MG per tablet Take 1 tablet by mouth daily.     . meclizine (ANTIVERT) 25 MG tablet Take 25 mg by mouth 4 (four) times daily as needed for dizziness.    . methocarbamol (ROBAXIN) 500 MG tablet Take 1 tablet (500 mg total) by mouth every 8 (eight) hours as needed for muscle spasms. 20 tablet 0  . zolpidem (AMBIEN) 10 MG tablet Take 10 mg by mouth at bedtime.      No current facility-administered medications for this visit.       Physical Exam  Blood pressure (!) 167/101, pulse 75, temperature (!) 97.2 F (36.2 C), height 5\' 5"  (1.651 m), weight 214 lb 6 oz (97.2 kg).  Constitutional: overall normal hygiene, normal nutrition, well developed, normal grooming, normal body habitus. Assistive device:none  Musculoskeletal: gait and station Limp left, muscle tone and strength are normal, no tremors or atrophy is present.  .  Neurological: coordination overall normal.  Deep tendon reflex/nerve stretch intact.  Sensation normal.  Cranial nerves II-XII intact.   Skin:   Normal overall no scars, lesions, ulcers or rashes. No psoriasis.  Psychiatric: Alert and oriented x 3.  Recent memory intact, remote memory unclear.  Normal mood and affect. Well groomed.  Good eye contact.  Cardiovascular: overall no swelling, no varicosities, no edema bilaterally, normal temperatures of the legs and arms, no clubbing, cyanosis and good capillary refill.  Lymphatic: palpation is normal.  Left hip with full ROM, slight tenderness over the lateral trochanteric area,no redness.    Left knee with some effusion, crepitus, ROM 0 to 110, slight limp, NV intact.  Knee is stable.  All other systems reviewed and are negative   The patient has been educated about the nature of the problem(s) and counseled on treatment options.  The patient appeared to understand what I have discussed and is in agreement with it.  Encounter Diagnoses  Name Primary?  . Chronic pain of left knee Yes  . Left hip pain     PLAN Call if any problems.  Precautions discussed.  Continue current medications.   Return to clinic 1 month   Electronically Signed Sanjuana Kava, MD 11/17/20208:12 AM

## 2019-06-24 ENCOUNTER — Telehealth: Payer: Self-pay

## 2019-06-24 DIAGNOSIS — M25562 Pain in left knee: Secondary | ICD-10-CM

## 2019-06-24 DIAGNOSIS — G8929 Other chronic pain: Secondary | ICD-10-CM

## 2019-06-24 NOTE — Telephone Encounter (Signed)
Her medicine is to last one month.  Not due until next week.

## 2019-06-24 NOTE — Telephone Encounter (Signed)
Hydrocodone-Acetaminophen  7.5/325mg   Qty 40 Tablets  PATIENT USES [facility]

## 2019-07-02 ENCOUNTER — Encounter: Payer: Self-pay | Admitting: Orthopaedic Surgery

## 2019-07-02 ENCOUNTER — Ambulatory Visit (INDEPENDENT_AMBULATORY_CARE_PROVIDER_SITE_OTHER): Payer: Medicare HMO | Admitting: Orthopaedic Surgery

## 2019-07-02 ENCOUNTER — Other Ambulatory Visit: Payer: Self-pay

## 2019-07-02 DIAGNOSIS — G8929 Other chronic pain: Secondary | ICD-10-CM

## 2019-07-02 DIAGNOSIS — M25562 Pain in left knee: Secondary | ICD-10-CM | POA: Diagnosis not present

## 2019-07-02 MED ORDER — HYDROCODONE-ACETAMINOPHEN 7.5-325 MG PO TABS: 1.0000 | ORAL_TABLET | Freq: Four times a day (QID) | ORAL | 0 refills | Status: DC | PRN

## 2019-07-02 NOTE — Progress Notes (Signed)
PROCEDURE NOTE:  The patient requests injections of the left knee , verbal consent was obtained.  The left knee was prepped appropriately after time out was performed.   Sterile technique was observed and injection of 1 cc of Depo-Medrol 40 mg with several cc's of plain xylocaine. Anesthesia was provided by ethyl chloride and a 20-gauge needle was used to inject the knee area. The injection was tolerated well.  A band aid dressing was applied.  The patient was advised to apply ice later today and tomorrow to the injection sight as needed.  I have reviewed the [place] web site prior to prescribing narcotic medicine for this patient.   I will see her in six weeks.  Call if any problem.  Precautions discussed.   Electronically Caspian, MD 12/1/20202:15 PM

## 2019-07-18 ENCOUNTER — Encounter: Payer: Self-pay | Admitting: Radiology

## 2019-07-18 NOTE — Progress Notes (Signed)
Patient called, requested that we fax our name/address/ph/fax to her [place] office.  Letter generated and faxed to [phone].

## 2019-07-23 ENCOUNTER — Ambulatory Visit: Payer: Medicare HMO | Admitting: Orthopaedic Surgery

## 2019-07-29 ENCOUNTER — Telehealth: Payer: Self-pay | Admitting: Orthopaedic Surgery

## 2019-07-29 DIAGNOSIS — G8929 Other chronic pain: Secondary | ICD-10-CM

## 2019-07-29 DIAGNOSIS — M25562 Pain in left knee: Secondary | ICD-10-CM

## 2019-07-29 MED ORDER — HYDROCODONE-ACETAMINOPHEN 7.5-325 MG PO TABS: 1.0000 | ORAL_TABLET | Freq: Four times a day (QID) | ORAL | 0 refills | Status: DC | PRN

## 2019-07-29 NOTE — Telephone Encounter (Signed)
Patient requests refill on Hydrocodone/Acetaminophen 7.5-325  Mgs.  Qty  40  Sig: Take 1 tablet by mouth every 6 (six) hours as needed for moderate pain (Must last 30 days).       Patient states she uses [facility]

## 2019-08-13 ENCOUNTER — Ambulatory Visit (INDEPENDENT_AMBULATORY_CARE_PROVIDER_SITE_OTHER): Payer: Medicare Other | Admitting: Orthopaedic Surgery

## 2019-08-13 ENCOUNTER — Encounter: Payer: Self-pay | Admitting: Orthopaedic Surgery

## 2019-08-13 ENCOUNTER — Other Ambulatory Visit: Payer: Self-pay

## 2019-08-13 VITALS — BP 151/85 | HR 67 | Temp 97.1°F | Ht 65.0 in | Wt 209.0 lb

## 2019-08-13 DIAGNOSIS — G8929 Other chronic pain: Secondary | ICD-10-CM

## 2019-08-13 DIAGNOSIS — M25562 Pain in left knee: Secondary | ICD-10-CM

## 2019-08-13 DIAGNOSIS — M25552 Pain in left hip: Secondary | ICD-10-CM | POA: Diagnosis not present

## 2019-08-13 NOTE — Progress Notes (Signed)
My knee is much better  Her left knee has much less pain after the injection last time. She is walking better and doing better. She has no new trauma.  Her hip pain is present but not that tender.  ROM of the left knee is nearly full. She has no effusion and has crepitus.  She has normal gait.  NV intact.  Encounter Diagnoses  Name Primary?  . Chronic pain of left knee Yes  . Left hip pain    Return in two months.  Call if any problem.  Precautions discussed.   Electronically Signed Sanjuana Kava, MD 1/12/20219:04 AM

## 2019-08-27 ENCOUNTER — Telehealth: Payer: Self-pay | Admitting: Orthopaedic Surgery

## 2019-08-27 DIAGNOSIS — G8929 Other chronic pain: Secondary | ICD-10-CM

## 2019-08-27 DIAGNOSIS — M25562 Pain in left knee: Secondary | ICD-10-CM

## 2019-08-27 MED ORDER — HYDROCODONE-ACETAMINOPHEN 7.5-325 MG PO TABS: 1.0000 | ORAL_TABLET | Freq: Four times a day (QID) | ORAL | 0 refills | Status: DC | PRN

## 2019-08-27 NOTE — Telephone Encounter (Signed)
Patient called for refill: HYDROcodone-acetaminophen (NORCO) 7.5-325 MG tablet 40 tablet  -[place]

## 2019-09-26 ENCOUNTER — Telehealth: Payer: Self-pay | Admitting: Orthopaedic Surgery

## 2019-09-26 DIAGNOSIS — M25562 Pain in left knee: Secondary | ICD-10-CM

## 2019-09-26 DIAGNOSIS — G8929 Other chronic pain: Secondary | ICD-10-CM

## 2019-09-26 MED ORDER — HYDROCODONE-ACETAMINOPHEN 7.5-325 MG PO TABS: 1.0000 | ORAL_TABLET | Freq: Four times a day (QID) | ORAL | 0 refills | Status: DC | PRN

## 2019-09-26 NOTE — Telephone Encounter (Signed)
Patient requests refill: HYDROcodone-acetaminophen (NORCO) 7.5-325 MG tablet 40 tablet  -[place]

## 2019-10-10 ENCOUNTER — Encounter: Payer: Self-pay | Admitting: Orthopaedic Surgery

## 2019-10-10 ENCOUNTER — Ambulatory Visit (INDEPENDENT_AMBULATORY_CARE_PROVIDER_SITE_OTHER): Payer: Medicare HMO | Admitting: Orthopaedic Surgery

## 2019-10-10 ENCOUNTER — Other Ambulatory Visit: Payer: Self-pay

## 2019-10-10 DIAGNOSIS — G8929 Other chronic pain: Secondary | ICD-10-CM | POA: Diagnosis not present

## 2019-10-10 DIAGNOSIS — M25562 Pain in left knee: Secondary | ICD-10-CM | POA: Diagnosis not present

## 2019-10-10 NOTE — Progress Notes (Signed)
PROCEDURE NOTE:  The patient requests injections of the left knee , verbal consent was obtained.  The left knee was prepped appropriately after time out was performed.   Sterile technique was observed and injection of 1 cc of Depo-Medrol 40 mg with several cc's of plain xylocaine. Anesthesia was provided by ethyl chloride and a 20-gauge needle was used to inject the knee area. The injection was tolerated well.  A band aid dressing was applied.  The patient was advised to apply ice later today and tomorrow to the injection sight as needed.  Return in two months.  Electronically Signed Sanjuana Kava, MD 3/11/20219:31 AM

## 2019-10-29 ENCOUNTER — Telehealth: Payer: Self-pay | Admitting: Orthopaedic Surgery

## 2019-10-29 DIAGNOSIS — G8929 Other chronic pain: Secondary | ICD-10-CM

## 2019-10-29 DIAGNOSIS — M25562 Pain in left knee: Secondary | ICD-10-CM

## 2019-10-29 MED ORDER — HYDROCODONE-ACETAMINOPHEN 7.5-325 MG PO TABS: 1.0000 | ORAL_TABLET | Freq: Four times a day (QID) | ORAL | 0 refills | Status: DC | PRN

## 2019-10-29 NOTE — Telephone Encounter (Signed)
Patient requests refill on Hydrocodone/Acetaminophen 7.5-325  Mgs.  Qty  40  Sig: Take 1 tablet by mouth every 6 (six) hours as needed for moderate pain (Must last 30 days).  Patient states she uses [facility]

## 2019-11-28 ENCOUNTER — Telehealth: Payer: Self-pay | Admitting: Orthopaedic Surgery

## 2019-11-28 DIAGNOSIS — M25562 Pain in left knee: Secondary | ICD-10-CM

## 2019-11-28 DIAGNOSIS — G8929 Other chronic pain: Secondary | ICD-10-CM

## 2019-11-28 MED ORDER — HYDROCODONE-ACETAMINOPHEN 7.5-325 MG PO TABS: 1.0000 | ORAL_TABLET | Freq: Four times a day (QID) | ORAL | 0 refills | Status: DC | PRN

## 2019-11-28 NOTE — Telephone Encounter (Signed)
Patient requests refill on Hydrocodone/Acetaminophen 7.5-325  Mgs.  Qty  40  Sig: Take 1 tablet by mouth every 6 (six) hours as needed for moderate pain (Must last 30 days).  Patient states she uses [facility]

## 2019-12-10 ENCOUNTER — Encounter: Payer: Self-pay | Admitting: Orthopaedic Surgery

## 2019-12-10 ENCOUNTER — Other Ambulatory Visit: Payer: Self-pay

## 2019-12-10 ENCOUNTER — Ambulatory Visit (INDEPENDENT_AMBULATORY_CARE_PROVIDER_SITE_OTHER): Payer: Medicare HMO | Admitting: Orthopaedic Surgery

## 2019-12-10 VITALS — Ht 65.0 in | Wt 200.0 lb

## 2019-12-10 DIAGNOSIS — G8929 Other chronic pain: Secondary | ICD-10-CM | POA: Diagnosis not present

## 2019-12-10 DIAGNOSIS — M25562 Pain in left knee: Secondary | ICD-10-CM

## 2019-12-10 NOTE — Progress Notes (Signed)
PROCEDURE NOTE:  The patient requests injections of the left knee , verbal consent was obtained.  The left knee was prepped appropriately after time out was performed.   Sterile technique was observed and injection of 1 cc of Depo-Medrol 40 mg with several cc's of plain xylocaine. Anesthesia was provided by ethyl chloride and a 20-gauge needle was used to inject the knee area. The injection was tolerated well.  A band aid dressing was applied.  The patient was advised to apply ice later today and tomorrow to the injection sight as needed.  Return in two months.  Electronically Signed Sanjuana Kava, MD 5/11/20219:26 AM

## 2019-12-31 ENCOUNTER — Telehealth: Payer: Self-pay | Admitting: Orthopaedic Surgery

## 2019-12-31 DIAGNOSIS — G8929 Other chronic pain: Secondary | ICD-10-CM

## 2019-12-31 MED ORDER — HYDROCODONE-ACETAMINOPHEN 7.5-325 MG PO TABS: 1.0000 | ORAL_TABLET | Freq: Four times a day (QID) | ORAL | 0 refills | Status: DC | PRN

## 2019-12-31 NOTE — Telephone Encounter (Signed)
Patient called for refill: HYDROcodone-acetaminophen (NORCO) 7.5-325 MG tablet 35 tablet  -[place]

## 2020-01-09 ENCOUNTER — Ambulatory Visit (INDEPENDENT_AMBULATORY_CARE_PROVIDER_SITE_OTHER): Payer: Medicare HMO | Admitting: Orthopaedic Surgery

## 2020-01-09 ENCOUNTER — Encounter: Payer: Self-pay | Admitting: Orthopaedic Surgery

## 2020-01-09 ENCOUNTER — Other Ambulatory Visit: Payer: Self-pay

## 2020-01-09 DIAGNOSIS — G8929 Other chronic pain: Secondary | ICD-10-CM

## 2020-01-09 DIAGNOSIS — M25562 Pain in left knee: Secondary | ICD-10-CM | POA: Diagnosis not present

## 2020-01-09 NOTE — Progress Notes (Signed)
PROCEDURE NOTE:  The patient requests injections of the left knee , verbal consent was obtained.  The left knee was prepped appropriately after time out was performed.   Sterile technique was observed and injection of 1 cc of Depo-Medrol 40 mg with several cc's of plain xylocaine. Anesthesia was provided by ethyl chloride and a 20-gauge needle was used to inject the knee area. The injection was tolerated well.  A band aid dressing was applied.  The patient was advised to apply ice later today and tomorrow to the injection sight as needed.  See in two months.  Electronically Signed Sanjuana Kava, MD 6/10/202110:59 AM

## 2020-01-28 ENCOUNTER — Other Ambulatory Visit: Payer: Self-pay | Admitting: Radiology

## 2020-01-28 DIAGNOSIS — M25562 Pain in left knee: Secondary | ICD-10-CM

## 2020-01-28 MED ORDER — HYDROCODONE-ACETAMINOPHEN 7.5-325 MG PO TABS: 1.0000 | ORAL_TABLET | Freq: Four times a day (QID) | ORAL | 0 refills | Status: DC | PRN

## 2020-01-28 NOTE — Telephone Encounter (Signed)
Rx refill requested.  [place].

## 2020-02-06 ENCOUNTER — Encounter: Payer: Self-pay | Admitting: Orthopaedic Surgery

## 2020-02-06 ENCOUNTER — Ambulatory Visit (INDEPENDENT_AMBULATORY_CARE_PROVIDER_SITE_OTHER): Payer: Medicare HMO | Admitting: Orthopaedic Surgery

## 2020-02-06 ENCOUNTER — Other Ambulatory Visit: Payer: Self-pay

## 2020-02-06 VITALS — Ht 65.0 in | Wt 194.0 lb

## 2020-02-06 DIAGNOSIS — G8929 Other chronic pain: Secondary | ICD-10-CM

## 2020-02-06 DIAGNOSIS — M25562 Pain in left knee: Secondary | ICD-10-CM | POA: Diagnosis not present

## 2020-02-06 NOTE — Progress Notes (Signed)
PROCEDURE NOTE:  The patient requests injections of the left knee , verbal consent was obtained.  The left knee was prepped appropriately after time out was performed.   Sterile technique was observed and injection of 1 cc of Depo-Medrol 40 mg with several cc's of plain xylocaine. Anesthesia was provided by ethyl chloride and a 20-gauge needle was used to inject the knee area. The injection was tolerated well.  A band aid dressing was applied.  The patient was advised to apply ice later today and tomorrow to the injection sight as needed.  Return in two months.  Electronically Signed Sanjuana Kava, MD 7/8/20219:05 AM

## 2020-02-27 ENCOUNTER — Telehealth: Payer: Self-pay | Admitting: Orthopaedic Surgery

## 2020-02-27 DIAGNOSIS — G8929 Other chronic pain: Secondary | ICD-10-CM

## 2020-02-27 MED ORDER — HYDROCODONE-ACETAMINOPHEN 7.5-325 MG PO TABS: 1.0000 | ORAL_TABLET | Freq: Four times a day (QID) | ORAL | 0 refills | Status: DC | PRN

## 2020-02-27 NOTE — Telephone Encounter (Signed)
Patient requests refill on Hydrocodone/Acetaminophen 7.5-325  Mgs.  Qty  35  Sig: Take 1 tablet by mouth every 6 (six) hours as needed for moderate pain (Must last 30 days).  Patient states she uses [facility] 

## 2020-03-17 ENCOUNTER — Ambulatory Visit (INDEPENDENT_AMBULATORY_CARE_PROVIDER_SITE_OTHER): Payer: Medicare HMO | Admitting: Orthopaedic Surgery

## 2020-03-17 ENCOUNTER — Other Ambulatory Visit: Payer: Self-pay

## 2020-03-17 ENCOUNTER — Encounter: Payer: Self-pay | Admitting: Orthopaedic Surgery

## 2020-03-17 DIAGNOSIS — M25562 Pain in left knee: Secondary | ICD-10-CM | POA: Diagnosis not present

## 2020-03-17 DIAGNOSIS — G8929 Other chronic pain: Secondary | ICD-10-CM | POA: Diagnosis not present

## 2020-03-17 NOTE — Progress Notes (Signed)
PROCEDURE NOTE:  The patient requests injections of the left knee , verbal consent was obtained.  The left knee was prepped appropriately after time out was performed.   Sterile technique was observed and injection of 1 cc of Depo-Medrol 40 mg with several cc's of plain xylocaine. Anesthesia was provided by ethyl chloride and a 20-gauge needle was used to inject the knee area. The injection was tolerated well.  A band aid dressing was applied.  The patient was advised to apply ice later today and tomorrow to the injection sight as needed.  Return in two months.  Call if any problem.  Precautions discussed.   Electronically Signed Sanjuana Kava, MD 8/17/20219:16 AM

## 2020-03-22 IMAGING — CT CT HEAD W/O CM
3 series · 16 of 47 positions shown, 19 images · non-contrast
Comparison: Head CT 08/25/2013.

CLINICAL DATA: 67-year-old female with history of dizziness and
nausea today.

EXAM:
CT HEAD WITHOUT CONTRAST
TECHNIQUE: Contiguous axial images were obtained from the base of the skull
through the vertex without intravenous contrast.

[Series 2: head wo · axial · 0.45mm/px · z∈[+728,+858]mm · 10 of 32 slices shown, 13 images]
[im 3/32  brain]
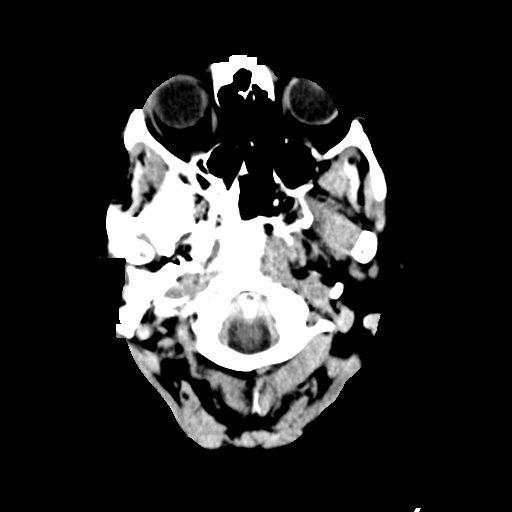
[im 3/32  bone]
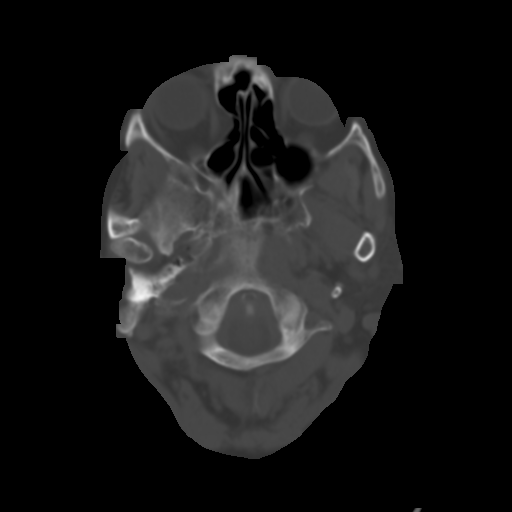
[im 6/32  brain]
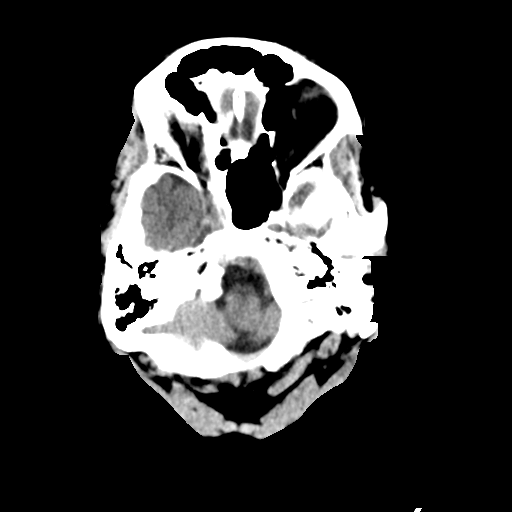
[im 9/32  brain]
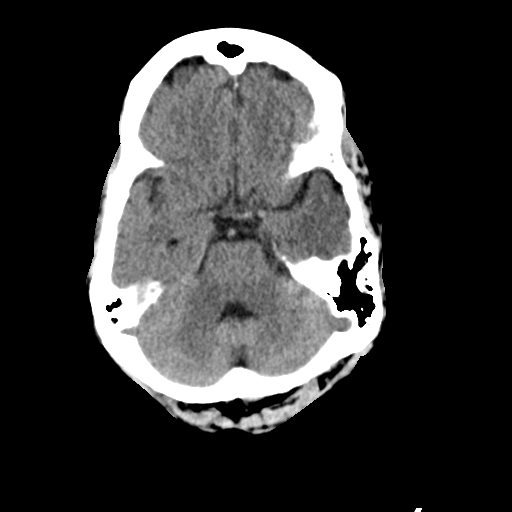
[im 11/32  brain]
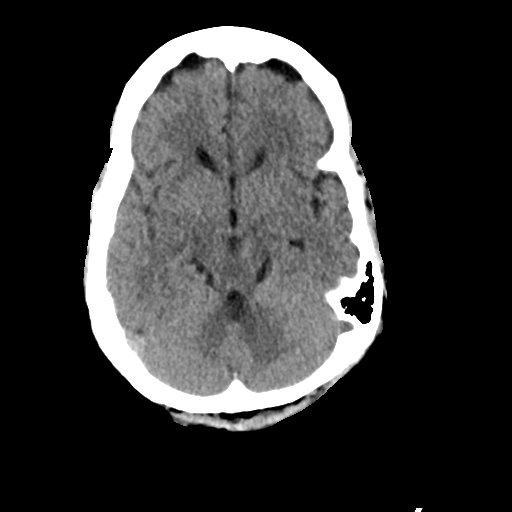
[im 14/32  brain]
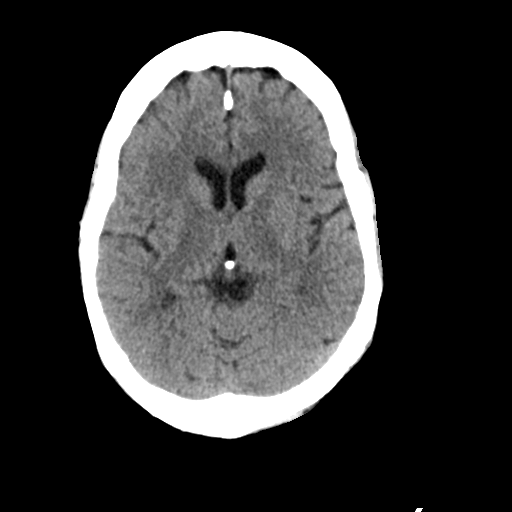
[im 14/32  bone]
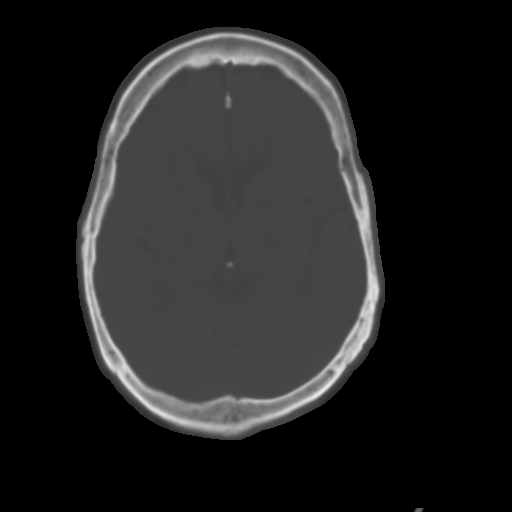
[im 18/32  brain]
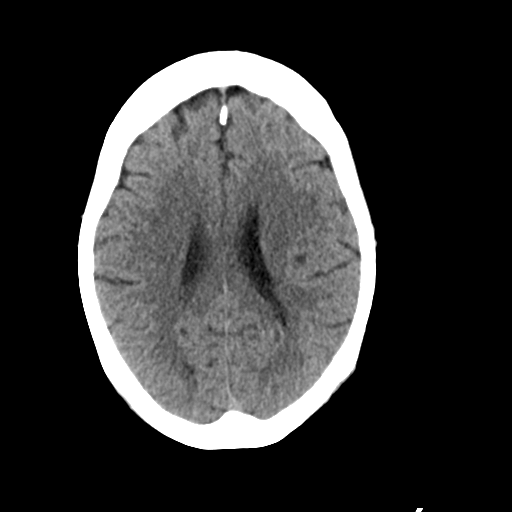
[im 21/32  brain]
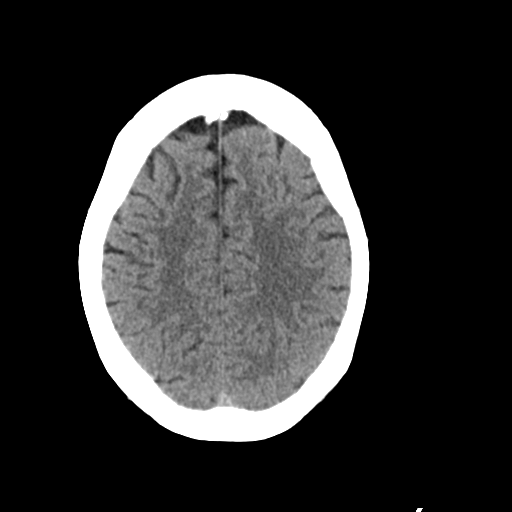
[im 24/32  brain]
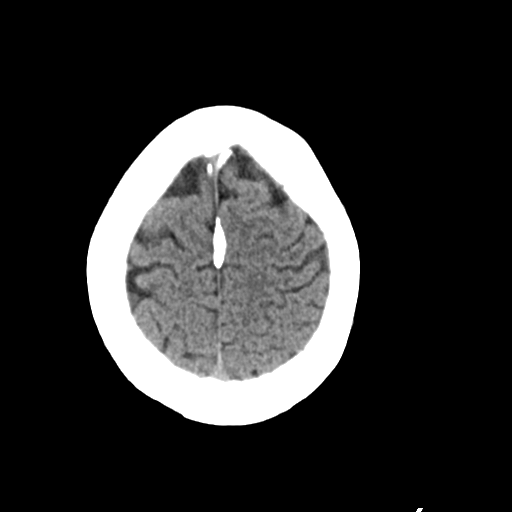
[im 26/32  brain]
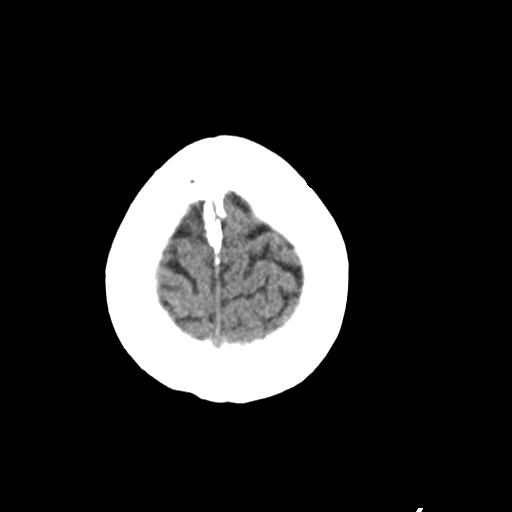
[im 26/32  bone]
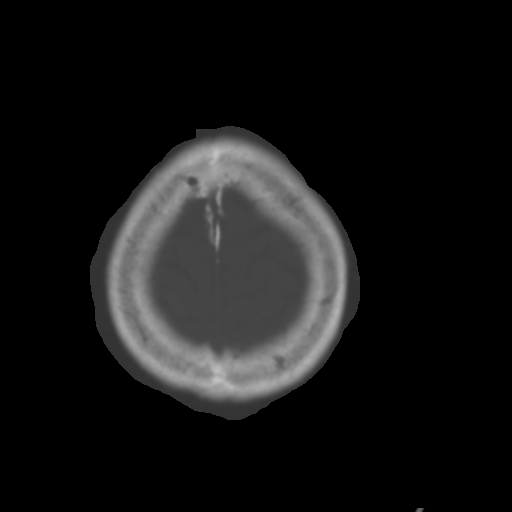
[im 29/32  brain]
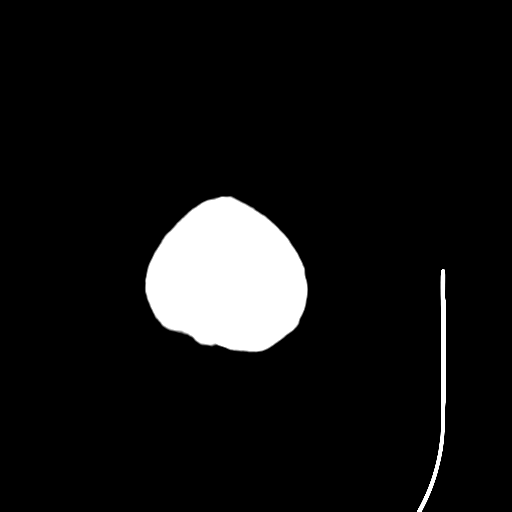

[Series 4: coronal soft tissue · coronal · 0.32mm/px · 3 of 67 slices shown]
[im 23/67  brain]
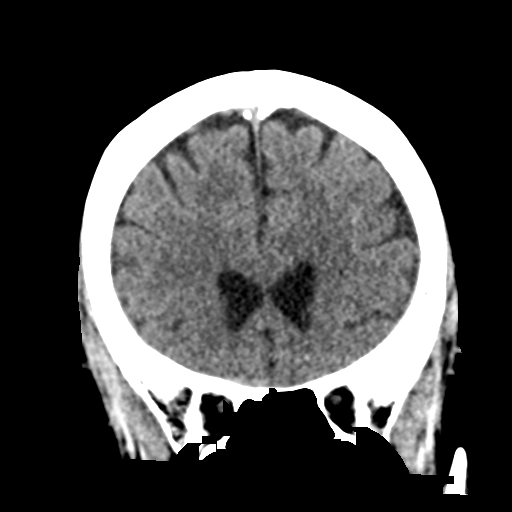
[im 30/67  brain]
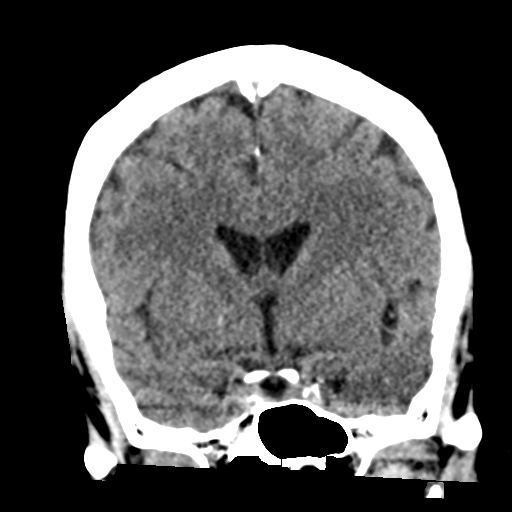
[im 37/67  brain]
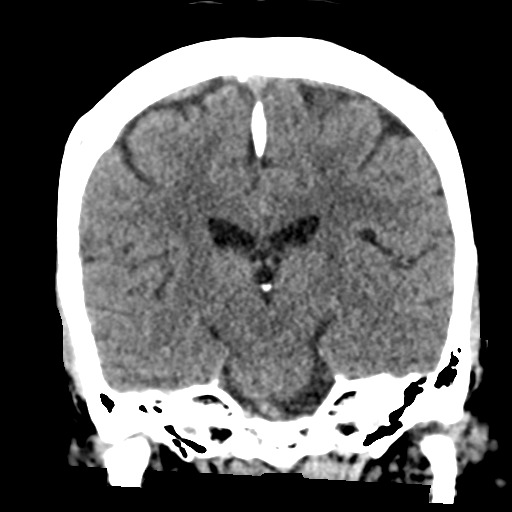

[Series 5: sagittal soft tissue · sagittal · 0.33mm/px · 3 of 54 slices shown]
[im 18/54  brain]
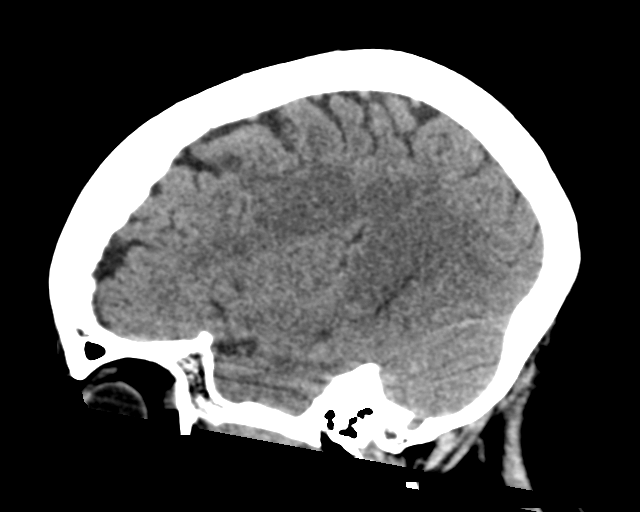
[im 27/54  brain]
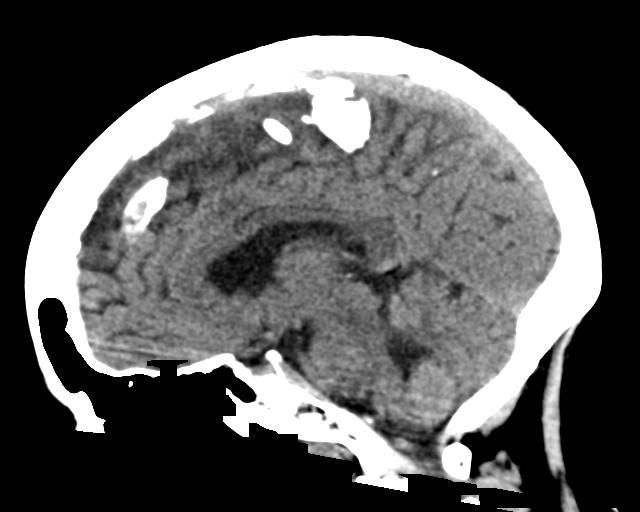
[im 36/54  brain]
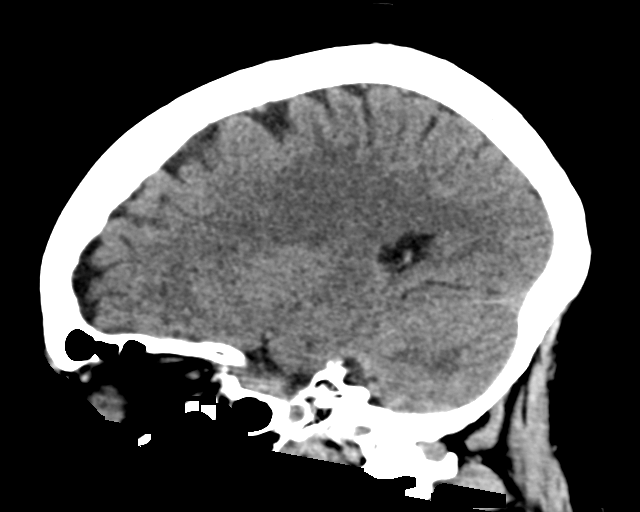

[16 of 47 positions shown; findings below may reference images not displayed]

FINDINGS: Brain: No evidence of acute infarction, hemorrhage, hydrocephalus,
extra-axial collection or mass lesion/mass effect.

Vascular: No hyperdense vessel or unexpected calcification.

Skull: Normal. Negative for fracture or focal lesion.

Sinuses/Orbits: No acute finding.

Other: None.
IMPRESSION: 1. No acute intracranial abnormalities. Normal appearance of the
brain.

## 2020-03-30 ENCOUNTER — Telehealth: Payer: Self-pay | Admitting: Orthopaedic Surgery

## 2020-03-30 DIAGNOSIS — M25562 Pain in left knee: Secondary | ICD-10-CM

## 2020-03-30 MED ORDER — HYDROCODONE-ACETAMINOPHEN 7.5-325 MG PO TABS: 1.0000 | ORAL_TABLET | Freq: Four times a day (QID) | ORAL | 0 refills | Status: DC | PRN

## 2020-03-30 NOTE — Telephone Encounter (Signed)
Call/voice message had been received from [place], per [facility], inquiring about status of medical records. I returned the call, and Dawn at this office relayed that patient has been trying to request records since May directly from [facility] records in [employer].  I relayed that our office has not received any requests for records per our viewing "Requests" tab.  I re-verified our phone and numbers; if their office needs to fax a request to our office directly, they will send to this fax number.

## 2020-03-30 NOTE — Telephone Encounter (Signed)
Patient requests refill on Hydrocodone/Acetaminophen 7.5-325  Mgs.  Qty  35  Sig: Take 1 tablet by mouth every 6 (six) hours as needed for moderate pain (Must last 30 days).  Patient states she uses [facility] 

## 2020-04-09 ENCOUNTER — Ambulatory Visit: Payer: Medicare HMO | Admitting: Orthopaedic Surgery

## 2020-04-30 ENCOUNTER — Telehealth: Payer: Self-pay | Admitting: Orthopaedic Surgery

## 2020-04-30 DIAGNOSIS — G8929 Other chronic pain: Secondary | ICD-10-CM

## 2020-04-30 MED ORDER — HYDROCODONE-ACETAMINOPHEN 7.5-325 MG PO TABS: 1.0000 | ORAL_TABLET | Freq: Four times a day (QID) | ORAL | 0 refills | Status: DC | PRN

## 2020-04-30 NOTE — Telephone Encounter (Signed)
Patient requests refill / aware of upcoming appointment 05/05/20: Medication:  HYDROcodone-acetaminophen (NORCO) 7.5-325 MG tablet 35 tablet  -[place]

## 2020-05-05 ENCOUNTER — Encounter: Payer: Self-pay | Admitting: Orthopaedic Surgery

## 2020-05-05 ENCOUNTER — Other Ambulatory Visit: Payer: Self-pay

## 2020-05-05 ENCOUNTER — Ambulatory Visit (INDEPENDENT_AMBULATORY_CARE_PROVIDER_SITE_OTHER): Payer: Medicare HMO | Admitting: Orthopaedic Surgery

## 2020-05-05 DIAGNOSIS — G8929 Other chronic pain: Secondary | ICD-10-CM

## 2020-05-05 DIAGNOSIS — M25562 Pain in left knee: Secondary | ICD-10-CM

## 2020-05-05 NOTE — Progress Notes (Signed)
PROCEDURE NOTE:  The patient requests injections of the left knee , verbal consent was obtained.  The left knee was prepped appropriately after time out was performed.   Sterile technique was observed and injection of 1 cc of Depo-Medrol 40 mg with several cc's of plain xylocaine. Anesthesia was provided by ethyl chloride and a 20-gauge needle was used to inject the knee area. The injection was tolerated well.  A band aid dressing was applied.  The patient was advised to apply ice later today and tomorrow to the injection sight as needed.  Return in two months.  Call if any problem.  Precautions discussed.   Electronically Signed Sanjuana Kava, MD 10/5/202110:00 AM

## 2020-05-14 ENCOUNTER — Ambulatory Visit: Payer: Medicare HMO | Admitting: Orthopaedic Surgery

## 2020-05-28 ENCOUNTER — Telehealth: Payer: Self-pay | Admitting: Orthopaedic Surgery

## 2020-05-28 DIAGNOSIS — M25562 Pain in left knee: Secondary | ICD-10-CM

## 2020-05-28 DIAGNOSIS — G8929 Other chronic pain: Secondary | ICD-10-CM

## 2020-05-28 MED ORDER — HYDROCODONE-ACETAMINOPHEN 7.5-325 MG PO TABS: 1.0000 | ORAL_TABLET | Freq: Four times a day (QID) | ORAL | 0 refills | Status: DC | PRN

## 2020-05-28 NOTE — Telephone Encounter (Signed)
Patient requests refill on Hydrocodone/Acetaminophen 7.5-325  Mgs.  Qty  35  Sig: Take 1 tablet by mouth every 6 (six) hours as needed for moderate pain (Must last 30 days).  Patient states she uses [facility]

## 2020-06-30 ENCOUNTER — Telehealth: Payer: Self-pay | Admitting: Orthopaedic Surgery

## 2020-06-30 ENCOUNTER — Other Ambulatory Visit: Payer: Self-pay | Admitting: Orthopaedic Surgery

## 2020-06-30 DIAGNOSIS — G8929 Other chronic pain: Secondary | ICD-10-CM

## 2020-06-30 DIAGNOSIS — M25562 Pain in left knee: Secondary | ICD-10-CM

## 2020-06-30 NOTE — Telephone Encounter (Signed)
Patient requests refill: HYDROcodone-acetaminophen (NORCO) 7.5-325 MG tablet 35 table  -[place]  -patient aware Dr Luna Glasgow is out of the office until Thursday, and one of our other providers are reviewing requests.

## 2020-07-07 ENCOUNTER — Other Ambulatory Visit: Payer: Self-pay

## 2020-07-07 ENCOUNTER — Encounter: Payer: Self-pay | Admitting: Orthopaedic Surgery

## 2020-07-07 ENCOUNTER — Ambulatory Visit (INDEPENDENT_AMBULATORY_CARE_PROVIDER_SITE_OTHER): Payer: Medicare HMO | Admitting: Orthopaedic Surgery

## 2020-07-07 DIAGNOSIS — G8929 Other chronic pain: Secondary | ICD-10-CM

## 2020-07-07 DIAGNOSIS — M25562 Pain in left knee: Secondary | ICD-10-CM

## 2020-07-07 NOTE — Progress Notes (Signed)
PROCEDURE NOTE:  The patient requests injections of the left knee , verbal consent was obtained.  The left knee was prepped appropriately after time out was performed.   Sterile technique was observed and injection of 1 cc of Depo-Medrol 40 mg with several cc's of plain xylocaine. Anesthesia was provided by ethyl chloride and a 20-gauge needle was used to inject the knee area. The injection was tolerated well.  A band aid dressing was applied.  The patient was advised to apply ice later today and tomorrow to the injection sight as needed.  See in two months.  Electronically Signed Sanjuana Kava, MD 12/7/20218:26 AM

## 2020-07-28 ENCOUNTER — Telehealth: Payer: Self-pay | Admitting: Orthopaedic Surgery

## 2020-07-28 DIAGNOSIS — M25562 Pain in left knee: Secondary | ICD-10-CM

## 2020-08-05 ENCOUNTER — Telehealth: Payer: Self-pay | Admitting: Orthopaedic Surgery

## 2020-08-05 NOTE — Telephone Encounter (Signed)
 Pharmacy called and needs clarification on the hydrocodone 5-325. Please advise

## 2020-08-06 NOTE — Telephone Encounter (Signed)
I called in medicine on 1-5.

## 2020-08-07 ENCOUNTER — Telehealth: Payer: Self-pay | Admitting: Orthopedic Surgery

## 2020-08-31 ENCOUNTER — Telehealth: Payer: Self-pay | Admitting: Orthopaedic Surgery

## 2020-08-31 NOTE — Telephone Encounter (Signed)
Medication refill

## 2020-09-08 ENCOUNTER — Encounter: Payer: Self-pay | Admitting: Orthopaedic Surgery

## 2020-09-08 ENCOUNTER — Other Ambulatory Visit: Payer: Self-pay

## 2020-09-08 ENCOUNTER — Ambulatory Visit (INDEPENDENT_AMBULATORY_CARE_PROVIDER_SITE_OTHER): Payer: Medicare HMO | Admitting: Orthopaedic Surgery

## 2020-09-08 DIAGNOSIS — G8929 Other chronic pain: Secondary | ICD-10-CM

## 2020-09-08 DIAGNOSIS — M25562 Pain in left knee: Secondary | ICD-10-CM | POA: Diagnosis not present

## 2020-09-08 NOTE — Progress Notes (Signed)
PROCEDURE NOTE:  The patient requests injections of the left knee , verbal consent was obtained.  The left knee was prepped appropriately after time out was performed.   Sterile technique was observed and injection of 1 cc of Depo-Medrol 40 mg with several cc's of plain xylocaine. Anesthesia was provided by ethyl chloride and a 20-gauge needle was used to inject the knee area. The injection was tolerated well.  A band aid dressing was applied.  The patient was advised to apply ice later today and tomorrow to the injection sight as needed.  Return in two months.  Electronically Signed Sanjuana Kava, MD 2/8/20228:21 AM

## 2020-10-05 ENCOUNTER — Telehealth: Payer: Self-pay | Admitting: Orthopaedic Surgery

## 2020-11-05 ENCOUNTER — Other Ambulatory Visit: Payer: Self-pay

## 2020-11-05 ENCOUNTER — Encounter: Payer: Self-pay | Admitting: Orthopaedic Surgery

## 2020-11-05 ENCOUNTER — Ambulatory Visit (INDEPENDENT_AMBULATORY_CARE_PROVIDER_SITE_OTHER): Payer: Medicare HMO | Admitting: Orthopaedic Surgery

## 2020-11-05 DIAGNOSIS — M25562 Pain in left knee: Secondary | ICD-10-CM

## 2020-11-05 DIAGNOSIS — G8929 Other chronic pain: Secondary | ICD-10-CM | POA: Diagnosis not present

## 2020-11-05 MED ORDER — HYDROCODONE-ACETAMINOPHEN 5-325 MG PO TABS: ORAL_TABLET | ORAL | 0 refills | Status: DC

## 2020-11-05 NOTE — Progress Notes (Signed)
PROCEDURE NOTE:  The patient requests injections of the left knee , verbal consent was obtained.  The left knee was prepped appropriately after time out was performed.   Sterile technique was observed and injection of 1 cc of Celestone 6 mg with several cc's of plain xylocaine. Anesthesia was provided by ethyl chloride and a 20-gauge needle was used to inject the knee area. The injection was tolerated well.  A band aid dressing was applied.  The patient was advised to apply ice later today and tomorrow to the injection sight as needed.  I have reviewed the [place] web site prior to prescribing narcotic medicine for this patient.   Return in two months.  Call if any problem.  Precautions discussed.   Electronically Signed Sanjuana Kava, MD 4/7/20228:33 AM

## 2020-12-04 ENCOUNTER — Telehealth: Payer: Self-pay | Admitting: Orthopaedic Surgery

## 2021-01-07 ENCOUNTER — Other Ambulatory Visit (HOSPITAL_COMMUNITY): Payer: Self-pay | Admitting: Adult Health

## 2021-01-07 ENCOUNTER — Other Ambulatory Visit: Payer: Self-pay

## 2021-01-07 ENCOUNTER — Ambulatory Visit (INDEPENDENT_AMBULATORY_CARE_PROVIDER_SITE_OTHER): Payer: Medicare HMO | Admitting: Orthopaedic Surgery

## 2021-01-07 ENCOUNTER — Encounter: Payer: Self-pay | Admitting: Orthopaedic Surgery

## 2021-01-07 VITALS — BP 159/71 | HR 69 | Ht 65.0 in | Wt 194.0 lb

## 2021-01-07 DIAGNOSIS — M25562 Pain in left knee: Secondary | ICD-10-CM | POA: Diagnosis not present

## 2021-01-07 DIAGNOSIS — G8929 Other chronic pain: Secondary | ICD-10-CM | POA: Diagnosis not present

## 2021-01-07 DIAGNOSIS — Z1231 Encounter for screening mammogram for malignant neoplasm of breast: Secondary | ICD-10-CM

## 2021-01-07 MED ORDER — HYDROCODONE-ACETAMINOPHEN 5-325 MG PO TABS: ORAL_TABLET | ORAL | 0 refills | Status: DC

## 2021-01-07 NOTE — Progress Notes (Signed)
My knee is a little better  She has periods of more swelling and pain of the left knee.  Last week she had more pain and swelling but no trauma.  This week it feels better. She has no redness, no giving way, no deep pain.  The left lower extremity is examined:  Inspection:  Thigh:  Non-tender and no defects  Knee has swelling 1/2+ effusion.                        Joint tenderness is present                        Patient is tender over the medial joint line  Lower Leg:  Has normal appearance and no tenderness or defects  Ankle:  Non-tender and no defects  Foot:  Non-tender and no defects Range of Motion:  Knee:  Range of motion is: 0-115                        Crepitus is  present  Ankle:  Range of motion is normal. Strength and Tone:  The left lower extremity has normal strength and tone. Stability:  Knee:  The knee is stable.  Ankle:  The ankle is stable.  Encounter Diagnosis  Name Primary?   Chronic pain of left knee Yes   Continue present management. Return in two months.  I will renew pain medicine.  I have reviewed the [place] web site prior to prescribing narcotic medicine for this patient.  Call if any problem.  Precautions discussed.    Electronically Signed Sanjuana Kava, MD 6/9/20228:29 AM

## 2021-01-14 ENCOUNTER — Ambulatory Visit (HOSPITAL_COMMUNITY)
Admission: RE | Admit: 2021-01-14 | Discharge: 2021-01-14 | Disposition: A | Discharge status: Home / Self Care | Payer: Medicare HMO | Source: Ambulatory Visit | Attending: Adult Health | Admitting: Adult Health

## 2021-01-14 DIAGNOSIS — Z1231 Encounter for screening mammogram for malignant neoplasm of breast: Secondary | ICD-10-CM | POA: Diagnosis not present

## 2021-02-03 ENCOUNTER — Other Ambulatory Visit: Payer: Self-pay | Admitting: Orthopaedic Surgery

## 2021-02-11 DIAGNOSIS — I129 Hypertensive chronic kidney disease with stage 1 through stage 4 chronic kidney disease, or unspecified chronic kidney disease: Secondary | ICD-10-CM | POA: Insufficient documentation

## 2021-03-03 ENCOUNTER — Telehealth: Payer: Self-pay | Admitting: Orthopaedic Surgery

## 2021-03-16 ENCOUNTER — Ambulatory Visit (INDEPENDENT_AMBULATORY_CARE_PROVIDER_SITE_OTHER): Payer: Medicare HMO | Admitting: Orthopaedic Surgery

## 2021-03-16 ENCOUNTER — Encounter: Payer: Self-pay | Admitting: Orthopaedic Surgery

## 2021-03-16 ENCOUNTER — Other Ambulatory Visit: Payer: Self-pay

## 2021-03-16 DIAGNOSIS — G8929 Other chronic pain: Secondary | ICD-10-CM | POA: Diagnosis not present

## 2021-03-16 DIAGNOSIS — M25562 Pain in left knee: Secondary | ICD-10-CM

## 2021-03-16 NOTE — Progress Notes (Signed)
PROCEDURE NOTE:  The patient requests injections of the left knee , verbal consent was obtained.  The left knee was prepped appropriately after time out was performed.   Sterile technique was observed and injection of 1 cc of Celestone 6 mg with several cc's of plain xylocaine. Anesthesia was provided by ethyl chloride and a 20-gauge needle was used to inject the knee area. The injection was tolerated well.  A band aid dressing was applied.  The patient was advised to apply ice later today and tomorrow to the injection sight as needed.   Return in two months.  Call if any problem.  Precautions discussed.  Electronically Signed Sanjuana Kava, MD 8/16/20228:45 AM

## 2021-03-30 ENCOUNTER — Encounter: Payer: Self-pay | Admitting: *Deleted

## 2021-04-06 ENCOUNTER — Telehealth: Payer: Self-pay | Admitting: Orthopaedic Surgery

## 2021-04-19 ENCOUNTER — Other Ambulatory Visit: Payer: Self-pay

## 2021-04-19 ENCOUNTER — Emergency Department (HOSPITAL_COMMUNITY)
Admission: EM | Admit: 2021-04-19 | Discharge: 2021-04-20 | Disposition: A | Discharge status: Home / Self Care | Payer: Medicare HMO | Attending: Emergency Medicine | Admitting: Emergency Medicine

## 2021-04-19 DIAGNOSIS — I10 Essential (primary) hypertension: Secondary | ICD-10-CM | POA: Diagnosis not present

## 2021-04-19 DIAGNOSIS — R42 Dizziness and giddiness: Secondary | ICD-10-CM | POA: Insufficient documentation

## 2021-04-19 DIAGNOSIS — Z79899 Other long term (current) drug therapy: Secondary | ICD-10-CM | POA: Insufficient documentation

## 2021-04-19 MED ORDER — MECLIZINE HCL 12.5 MG PO TABS
25.0000 mg | ORAL_TABLET | Freq: Once | ORAL | Status: AC
  Administered 2021-04-20: 25 mg via ORAL
  Filled 2021-04-19: qty 2

## 2021-04-19 MED ORDER — ONDANSETRON 4 MG PO TBDP
4.0000 mg | ORAL_TABLET | Freq: Once | ORAL | Status: AC
  Administered 2021-04-20: 4 mg via ORAL
  Filled 2021-04-19: qty 1

## 2021-04-19 NOTE — ED Provider Notes (Signed)
[facility] EMERGENCY DEPARTMENT Provider Note   CSN: [id number] Arrival date & time: 04/19/21  2301     History Chief Complaint  Patient presents with   Dizziness    Megan Barber is a 70 y.o. female.  Patient with a history of hypertension and previous vertigo here with room spinning dizziness that onset about 9 PM as she was getting out of bed.  States she was just lying in bed and believes that she got up too quickly and felt dizzy with room spinning.  Symptoms have been ongoing since 9 PM approximately 3 hours ago.  Symptoms are worse with position change and standing.  Had similar symptoms about 20 years ago when she was diagnosed with vertigo.  Denies any nausea or vomiting.  Denies any headache or visual changes.  No chest pain or shortness of breath.  No focal weakness, numbness or tingling.  No difficulty speaking or difficulty swallowing. Feels symptoms are improving. Did have an ED visit in 2019 for dizziness when asked about this she says it feels similar  The history is provided by the patient.  Dizziness Associated symptoms: no chest pain, no nausea, no shortness of breath, no vomiting and no weakness       Past Medical History:  Diagnosis Date   Chronic back pain    GERD (gastroesophageal reflux disease)    Hypertension    Osteoporosis    Tubular adenoma    Colonoscopy October 2019 - Dr. Oneida Alar   Vertigo     Patient Active Problem List   Diagnosis Date Noted   History of adenomatous polyp of colon 05/18/2018   Constipation 02/12/2018   Hematochezia 02/12/2018   Chronic pain of left knee 09/16/2015    Past Surgical History:  Procedure Laterality Date   ABCESS DRAINAGE     suprapubic   CHOLECYSTECTOMY     COLONOSCOPY WITH PROPOFOL N/A 05/01/2018   Procedure: COLONOSCOPY WITH PROPOFOL;  Surgeon: Danie Binder, MD;  Location: [facility] ENDO SUITE;  Service: Endoscopy;  Laterality: N/A;  1:15pm   INCISION AND DRAINAGE ABSCESS Right 01/30/2015   Procedure:  INCISION AND DRAINAGE RIGHT AXILLARY ABSCESS;  Surgeon: Aviva Signs, MD;  Location: [facility] ORS;  Service: General;  Laterality: Right;   POLYPECTOMY  05/01/2018   Procedure: POLYPECTOMY;  Surgeon: Danie Binder, MD;  Location: [facility] ENDO SUITE;  Service: Endoscopy;;  cecal polyp, transverse polyps x2   TUBAL LIGATION       OB History     Gravida  3   Para  2   Term  2   Preterm      AB  1   Living         SAB  1   IAB      Ectopic      Multiple      Live Births              Family History  Problem Relation Age of Onset   Hypertension Mother    Colon cancer Mother    Hypertension Father    Hypertension Other     Social History   Tobacco Use   Smoking status: Never   Smokeless tobacco: Never  Substance Use Topics   Alcohol use: No   Drug use: No    Home Medications Prior to Admission medications   Medication Sig Start Date End Date Taking? Authorizing Provider  HYDROcodone-acetaminophen (NORCO/VICODIN) 5-325 MG tablet TAKE ONE TABLET BY MOUTH EVERY 6 HOURS FOR  PAIN. 04/06/21   Sanjuana Kava, MD  acetaminophen (TYLENOL) 325 MG tablet Take 650 mg by mouth every 6 (six) hours as needed for pain.    [provider]  amLODipine (NORVASC) 2.5 MG tablet Take 2.5 mg by mouth every evening. 04/10/18   [provider]  hydrochlorothiazide (MICROZIDE) 12.5 MG capsule  03/18/19   [provider]  lisinopril (ZESTRIL) 10 MG tablet  03/18/19   [provider]  lisinopril-hydrochlorothiazide (PRINZIDE,ZESTORETIC) 10-12.5 MG per tablet Take 1 tablet by mouth daily.     [provider]  meclizine (ANTIVERT) 25 MG tablet Take 25 mg by mouth 4 (four) times daily as needed for dizziness. 04/10/18   [provider]  methocarbamol (ROBAXIN) 500 MG tablet Take 1 tablet (500 mg total) by mouth every 8 (eight) hours as needed for muscle spasms. 05/22/19   Julianne Rice, MD  zolpidem (AMBIEN) 10 MG tablet Take 10 mg by mouth at  bedtime.  01/29/18   [provider]    Allergies    Darvocet [propoxyphene n-acetaminophen] and Toradol [ketorolac tromethamine]  Review of Systems   Review of Systems  Constitutional:  Negative for activity change, appetite change, fatigue and fever.  HENT:  Negative for congestion and mouth sores.   Eyes:  Negative for visual disturbance.  Respiratory:  Negative for cough, chest tightness and shortness of breath.   Cardiovascular:  Negative for chest pain.  Gastrointestinal:  Negative for abdominal pain, nausea and vomiting.  Genitourinary:  Negative for dysuria and hematuria.  Musculoskeletal:  Negative for arthralgias, myalgias and neck pain.  Skin:  Negative for rash.  Neurological:  Positive for dizziness and light-headedness. Negative for weakness.   all other systems are negative except as noted in the HPI and PMH.   Physical Exam Updated Vital Signs BP (!) 152/89   Pulse 65   Temp 98.5 F (36.9 C) (Oral)   Resp 20   Ht 5\' 5"  (1.651 m)   Wt 90.7 kg   SpO2 98%   BMI 33.28 kg/m   Physical Exam Vitals and nursing note reviewed.  Constitutional:      General: She is not in acute distress.    Appearance: She is well-developed. She is obese.  HENT:     Head: Normocephalic and atraumatic.     Mouth/Throat:     Pharynx: No oropharyngeal exudate.  Eyes:     Conjunctiva/sclera: Conjunctivae normal.     Pupils: Pupils are equal, round, and reactive to light.  Neck:     Comments: No meningismus. Cardiovascular:     Rate and Rhythm: Normal rate and regular rhythm.     Heart sounds: Normal heart sounds. No murmur heard. Pulmonary:     Effort: Pulmonary effort is normal. No respiratory distress.     Breath sounds: Normal breath sounds.  Abdominal:     Palpations: Abdomen is soft.     Tenderness: There is no abdominal tenderness. There is no guarding or rebound.  Musculoskeletal:        General: No tenderness. Normal range of motion.     Cervical back: Normal  range of motion and neck supple.  Skin:    General: Skin is warm.  Neurological:     Mental Status: She is alert and oriented to person, place, and time.     Cranial Nerves: No cranial nerve deficit.     Motor: No abnormal muscle tone.     Coordination: Coordination normal.     Comments: CN  2-12 intact, no ataxia on finger to nose, no nystagmus, 5/5 strength throughout, no pronator drift, Romberg negative, normal gait.  Test of skew negative, head impulse testing negative, no appreciable nystagmus  Psychiatric:        Behavior: Behavior normal.    ED Results / Procedures / Treatments   Labs (all labs ordered are listed, but only abnormal results are displayed) Labs Reviewed  COMPREHENSIVE METABOLIC PANEL - Abnormal; Notable for the following components:      Result Value   Potassium 2.9 (*)    Glucose, Bld 111 (*)    Creatinine, Ser 1.30 (*)    Calcium 8.5 (*)    GFR, Estimated 45 (*)    Anion gap 4 (*)    All other components within normal limits  CBC WITH DIFFERENTIAL/PLATELET  TROPONIN I (HIGH SENSITIVITY)  TROPONIN I (HIGH SENSITIVITY)    EKG EKG Interpretation  Date/Time:  Monday April 19 2021 23:30:24 EDT Ventricular Rate:  58 PR Interval:  158 QRS Duration: 87 QT Interval:  431 QTC Calculation: 424 R Axis:   41 Text Interpretation: Sinus rhythm Abnormal R-wave progression, early transition Minimal ST elevation, inferior leads No significant change was found Confirmed by Ezequiel Essex [phone]) on 04/19/2021 11:32:54 PM  Radiology No results found.  Procedures Procedures   Medications Ordered in ED Medications - No data to display  ED Course  I have reviewed the triage vital signs and the nursing notes.  Pertinent labs & imaging results that were available during my care of the patient were reviewed by me and considered in my medical decision making (see chart for details).    MDM Rules/Calculators/A&P                           Vertigo without  neurological deficit.  Stable vital signs.  No nystagmus on finger-to-nose.  No headache or visual changes.  No chest pain or shortness of breath.  Patient given meclizine. Low suspicion for central cause of vertigo.  CTA shows no large vessel occlusion or significant stenosis.  No VBI.  Patient able to ambulate and tolerate p.o. states dizziness has improved.  Discussed likely peripheral vertigo.  Discussed cannot rule out central vertigo without MRI which not available at this time.  Patient understands a small risk of missing central vertigo but this seems less likely at this time. She does not wish to be transferred for MRI tonight.   We will treat prophylactically with meclizine and PCP follow-up.  Cautioned to avoid driving or operating machinery while taking meclizine. Follow-up with her doctor.  Return precautions discussed Final Clinical Impression(s) / ED Diagnoses Final diagnoses:  Vertigo    Rx / DC Orders ED Discharge Orders     None        Kizzi Overbey, Annie Main, MD 04/20/21 [phone]

## 2021-04-19 NOTE — ED Triage Notes (Signed)
Pt c/o dizziness x 3 hours.

## 2021-04-20 ENCOUNTER — Emergency Department (HOSPITAL_COMMUNITY): Payer: Medicare HMO

## 2021-04-20 DIAGNOSIS — R42 Dizziness and giddiness: Secondary | ICD-10-CM | POA: Diagnosis not present

## 2021-04-20 LAB — CBC WITH DIFFERENTIAL/PLATELET
Abs Immature Granulocytes: 0.01 10*3/uL (ref 0.00–0.07)
Basophils Absolute: 0 10*3/uL (ref 0.0–0.1)
Basophils Relative: 1 %
Eosinophils Absolute: 0.1 10*3/uL (ref 0.0–0.5)
Eosinophils Relative: 3 %
HCT: 37.2 % (ref 36.0–46.0)
Hemoglobin: 12 g/dL (ref 12.0–15.0)
Immature Granulocytes: 0 %
Lymphocytes Relative: 48 %
Lymphs Abs: 2.3 10*3/uL (ref 0.7–4.0)
MCH: 29.3 pg (ref 26.0–34.0)
MCHC: 32.3 g/dL (ref 30.0–36.0)
MCV: 90.7 fL (ref 80.0–100.0)
Monocytes Absolute: 0.5 10*3/uL (ref 0.1–1.0)
Monocytes Relative: 12 %
Neutro Abs: 1.7 10*3/uL (ref 1.7–7.7)
Neutrophils Relative %: 36 %
Platelets: 256 10*3/uL (ref 150–400)
RBC: 4.1 MIL/uL (ref 3.87–5.11)
RDW: 13.2 % (ref 11.5–15.5)
WBC: 4.7 10*3/uL (ref 4.0–10.5)
nRBC: 0 % (ref 0.0–0.2)

## 2021-04-20 LAB — COMPREHENSIVE METABOLIC PANEL
ALT: 11 U/L (ref 0–44)
AST: 18 U/L (ref 15–41)
Albumin: 3.6 g/dL (ref 3.5–5.0)
Alkaline Phosphatase: 54 U/L (ref 38–126)
Anion gap: 4 — ABNORMAL LOW (ref 5–15)
BUN: 17 mg/dL (ref 8–23)
CO2: 29 mmol/L (ref 22–32)
Calcium: 8.5 mg/dL — ABNORMAL LOW (ref 8.9–10.3)
Chloride: 103 mmol/L (ref 98–111)
Creatinine, Ser: 1.3 mg/dL — ABNORMAL HIGH (ref 0.44–1.00)
GFR, Estimated: 45 mL/min — ABNORMAL LOW (ref >60)
Glucose, Bld: 111 mg/dL — ABNORMAL HIGH (ref 70–99)
Potassium: 2.9 mmol/L — ABNORMAL LOW (ref 3.5–5.1)
Sodium: 136 mmol/L (ref 135–145)
Total Bilirubin: 0.3 mg/dL (ref 0.3–1.2)
Total Protein: 7.1 g/dL (ref 6.5–8.1)

## 2021-04-20 LAB — TROPONIN I (HIGH SENSITIVITY)
Troponin I (High Sensitivity): 3 ng/L (ref <18)
Troponin I (High Sensitivity): 3 ng/L (ref <18)

## 2021-04-20 MED ORDER — MECLIZINE HCL 25 MG PO TABS: 25.0000 mg | ORAL_TABLET | Freq: Three times a day (TID) | ORAL | 0 refills | Status: DC | PRN

## 2021-04-20 MED ORDER — IOHEXOL 350 MG/ML SOLN
75.0000 mL | Freq: Once | INTRAVENOUS | Status: AC | PRN
  Administered 2021-04-20: 75 mL via INTRAVENOUS

## 2021-04-20 MED ORDER — POTASSIUM CHLORIDE CRYS ER 20 MEQ PO TBCR: 20.0000 meq | EXTENDED_RELEASE_TABLET | Freq: Two times a day (BID) | ORAL | 0 refills | Status: AC

## 2021-04-20 MED ORDER — POTASSIUM CHLORIDE CRYS ER 20 MEQ PO TBCR
40.0000 meq | EXTENDED_RELEASE_TABLET | Freq: Once | ORAL | Status: AC
  Administered 2021-04-20: 40 meq via ORAL
  Filled 2021-04-20: qty 2

## 2021-04-20 NOTE — Discharge Instructions (Addendum)
Your testing is reassuring.  There are no narrowed blood vessels in your brain.  As we discussed the stroke cannot be ruled out completely without an MRI but this seems less likely at this time.  Take the dizziness medication as prescribed.  Follow-up with your doctor.  Return to the ED with new or worsening symptoms.

## 2021-04-20 NOTE — ED Notes (Signed)
Pt ambulated without issue.

## 2021-04-20 NOTE — ED Notes (Signed)
Pt drank full cup of water without issue.

## 2021-05-03 ENCOUNTER — Telehealth: Payer: Self-pay | Admitting: Orthopaedic Surgery

## 2021-05-18 ENCOUNTER — Other Ambulatory Visit: Payer: Self-pay

## 2021-05-18 ENCOUNTER — Encounter: Payer: Self-pay | Admitting: Orthopaedic Surgery

## 2021-05-18 ENCOUNTER — Ambulatory Visit (INDEPENDENT_AMBULATORY_CARE_PROVIDER_SITE_OTHER): Payer: Medicare HMO | Admitting: Orthopaedic Surgery

## 2021-05-18 VITALS — BP 162/79 | HR 70 | Ht 65.0 in | Wt 203.4 lb

## 2021-05-18 DIAGNOSIS — M25562 Pain in left knee: Secondary | ICD-10-CM | POA: Diagnosis not present

## 2021-05-18 DIAGNOSIS — G8929 Other chronic pain: Secondary | ICD-10-CM

## 2021-05-18 NOTE — Progress Notes (Signed)
My knee is a little better today.  She has less pain in the left knee today.  She has popping and swelling but less pain and no giving way.  The injection last time helped.  She has no new trauma, no redness, no numbness.  Left knee has effusion mild, crepitus and ROM 0 to 110, slight limp left, stable, NV intact.  Encounter Diagnosis  Name Primary?   Chronic pain of left knee Yes   Continue her medicine.  Return November 22.  She is going out of town for Thanksgiving and might need injection then.  Call if any problem.  Precautions discussed.  Electronically Signed Sanjuana Kava, MD 10/18/20229:04 AM

## 2021-06-01 ENCOUNTER — Telehealth: Payer: Self-pay | Admitting: Orthopaedic Surgery

## 2021-06-01 DIAGNOSIS — M25562 Pain in left knee: Secondary | ICD-10-CM

## 2021-06-01 DIAGNOSIS — G8929 Other chronic pain: Secondary | ICD-10-CM

## 2021-06-15 ENCOUNTER — Other Ambulatory Visit: Payer: Self-pay

## 2021-06-15 ENCOUNTER — Encounter: Payer: Self-pay | Admitting: Orthopaedic Surgery

## 2021-06-15 ENCOUNTER — Ambulatory Visit (INDEPENDENT_AMBULATORY_CARE_PROVIDER_SITE_OTHER): Payer: Medicare HMO | Admitting: Orthopaedic Surgery

## 2021-06-15 DIAGNOSIS — M25562 Pain in left knee: Secondary | ICD-10-CM

## 2021-06-15 DIAGNOSIS — G8929 Other chronic pain: Secondary | ICD-10-CM

## 2021-06-15 NOTE — Progress Notes (Signed)
PROCEDURE NOTE:  The patient requests injections of the left knee , verbal consent was obtained.  The left knee was prepped appropriately after time out was performed.   Sterile technique was observed and injection of 1 cc of DepoMedrol 40 mg with several cc's of plain xylocaine. Anesthesia was provided by ethyl chloride and a 20-gauge needle was used to inject the knee area. The injection was tolerated well.  A band aid dressing was applied.  The patient was advised to apply ice later today and tomorrow to the injection sight as needed.  Encounter Diagnosis  Name Primary?   Chronic pain of left knee Yes   She is going to [place] for Thanksgiving.  Return prn  Call if any problem.  Precautions discussed.  Electronically Signed Sanjuana Kava, MD 11/15/20228:33 AM

## 2021-06-22 ENCOUNTER — Ambulatory Visit: Payer: Medicare HMO | Admitting: Orthopaedic Surgery

## 2021-06-29 ENCOUNTER — Telehealth: Payer: Self-pay | Admitting: Orthopaedic Surgery

## 2021-06-29 DIAGNOSIS — M25562 Pain in left knee: Secondary | ICD-10-CM

## 2021-06-29 DIAGNOSIS — G8929 Other chronic pain: Secondary | ICD-10-CM

## 2021-07-28 ENCOUNTER — Other Ambulatory Visit: Payer: Self-pay | Admitting: Orthopaedic Surgery

## 2021-07-28 DIAGNOSIS — M25562 Pain in left knee: Secondary | ICD-10-CM

## 2021-08-30 ENCOUNTER — Telehealth: Payer: Self-pay | Admitting: Orthopedic Surgery

## 2021-08-30 DIAGNOSIS — M25562 Pain in left knee: Secondary | ICD-10-CM

## 2021-08-30 DIAGNOSIS — G8929 Other chronic pain: Secondary | ICD-10-CM

## 2021-09-02 ENCOUNTER — Other Ambulatory Visit: Payer: Self-pay

## 2021-09-02 ENCOUNTER — Encounter: Payer: Self-pay | Admitting: Orthopaedic Surgery

## 2021-09-02 ENCOUNTER — Ambulatory Visit (INDEPENDENT_AMBULATORY_CARE_PROVIDER_SITE_OTHER): Payer: Medicare HMO | Admitting: Orthopaedic Surgery

## 2021-09-02 DIAGNOSIS — G8929 Other chronic pain: Secondary | ICD-10-CM | POA: Diagnosis not present

## 2021-09-02 DIAGNOSIS — M25562 Pain in left knee: Secondary | ICD-10-CM | POA: Diagnosis not present

## 2021-09-02 NOTE — Progress Notes (Signed)
PROCEDURE NOTE:  The patient requests injections of the left knee , verbal consent was obtained.  The left knee was prepped appropriately after time out was performed.   Sterile technique was observed and injection of 1 cc of DepoMedrol 40 mg with several cc's of plain xylocaine. Anesthesia was provided by ethyl chloride and a 20-gauge needle was used to inject the knee area. The injection was tolerated well.  A band aid dressing was applied.  The patient was advised to apply ice later today and tomorrow to the injection sight as needed.  Encounter Diagnosis  Name Primary?   Chronic pain of left knee Yes   Return as needed.  Call if any problem.  Precautions discussed.  Electronically Signed Sanjuana Kava, MD 2/2/202310:12 AM

## 2021-09-28 ENCOUNTER — Other Ambulatory Visit: Payer: Self-pay | Admitting: Orthopaedic Surgery

## 2021-09-28 DIAGNOSIS — G8929 Other chronic pain: Secondary | ICD-10-CM

## 2021-10-27 ENCOUNTER — Telehealth: Payer: Self-pay | Admitting: Orthopaedic Surgery

## 2021-10-27 DIAGNOSIS — G8929 Other chronic pain: Secondary | ICD-10-CM

## 2021-11-25 ENCOUNTER — Encounter: Payer: Self-pay | Admitting: Orthopaedic Surgery

## 2021-11-25 ENCOUNTER — Ambulatory Visit (INDEPENDENT_AMBULATORY_CARE_PROVIDER_SITE_OTHER): Payer: Medicare HMO | Admitting: Orthopaedic Surgery

## 2021-11-25 VITALS — Ht 65.0 in | Wt 203.0 lb

## 2021-11-25 DIAGNOSIS — G8929 Other chronic pain: Secondary | ICD-10-CM | POA: Diagnosis not present

## 2021-11-25 DIAGNOSIS — M25562 Pain in left knee: Secondary | ICD-10-CM

## 2021-11-25 MED ORDER — HYDROCODONE-ACETAMINOPHEN 5-325 MG PO TABS: ORAL_TABLET | ORAL | 0 refills | Status: DC

## 2021-11-25 NOTE — Progress Notes (Signed)
PROCEDURE NOTE: ? ?The patient requests injections of the left knee , verbal consent was obtained. ? ?The left knee was prepped appropriately after time out was performed.  ? ?Sterile technique was observed and injection of 1 cc of DepoMedrol 40 mg with several cc's of plain xylocaine. Anesthesia was provided by ethyl chloride and a 20-gauge needle was used to inject the knee area. The injection was tolerated well.  A band aid dressing was applied. ? ?The patient was advised to apply ice later today and tomorrow to the injection sight as needed. ? ?Encounter Diagnosis  ?Name Primary?  ? Chronic pain of left knee Yes  ? ?Return prn ? ?I have reviewed the [place] web site prior to prescribing narcotic medicine for this patient. ? ?Call if any problem. ? ?Precautions discussed. ? ?Electronically Signed ?Sanjuana Kava, MD ?4/27/20238:57 AM ? ?

## 2021-12-28 ENCOUNTER — Other Ambulatory Visit: Payer: Self-pay | Admitting: Orthopaedic Surgery

## 2021-12-28 DIAGNOSIS — M25562 Pain in left knee: Secondary | ICD-10-CM

## 2022-01-24 ENCOUNTER — Other Ambulatory Visit: Payer: Self-pay | Admitting: Orthopedic Surgery

## 2022-01-24 DIAGNOSIS — G8929 Other chronic pain: Secondary | ICD-10-CM

## 2022-01-28 DIAGNOSIS — K219 Gastro-esophageal reflux disease without esophagitis: Secondary | ICD-10-CM | POA: Insufficient documentation

## 2022-02-17 ENCOUNTER — Ambulatory Visit (INDEPENDENT_AMBULATORY_CARE_PROVIDER_SITE_OTHER): Payer: Medicare HMO | Admitting: Orthopaedic Surgery

## 2022-02-17 ENCOUNTER — Encounter: Payer: Self-pay | Admitting: Orthopaedic Surgery

## 2022-02-17 DIAGNOSIS — M25562 Pain in left knee: Secondary | ICD-10-CM

## 2022-02-17 DIAGNOSIS — G8929 Other chronic pain: Secondary | ICD-10-CM

## 2022-02-17 MED ORDER — METHYLPREDNISOLONE ACETATE 40 MG/ML IJ SUSP
40.0000 mg | Freq: Once | INTRAMUSCULAR | Status: AC
  Administered 2022-02-17: 40 mg via INTRA_ARTICULAR

## 2022-02-17 NOTE — Progress Notes (Signed)
PROCEDURE NOTE:  The patient requests injections of the left knee , verbal consent was obtained.  The left knee was prepped appropriately after time out was performed.   Sterile technique was observed and injection of 1 cc of DepoMedrol 40 mg with several cc's of plain xylocaine. Anesthesia was provided by ethyl chloride and a 20-gauge needle was used to inject the knee area. The injection was tolerated well.  A band aid dressing was applied.  The patient was advised to apply ice later today and tomorrow to the injection sight as needed.  Encounter Diagnosis  Name Primary?   Chronic pain of left knee Yes   Return prn.  Call if any problem.  Precautions discussed.  Electronically Signed Sanjuana Kava, MD 7/20/20239:03 AM

## 2022-02-22 ENCOUNTER — Telehealth: Payer: Self-pay | Admitting: Orthopaedic Surgery

## 2022-02-22 DIAGNOSIS — G8929 Other chronic pain: Secondary | ICD-10-CM

## 2022-03-25 ENCOUNTER — Telehealth: Payer: Self-pay | Admitting: Orthopaedic Surgery

## 2022-03-25 DIAGNOSIS — G8929 Other chronic pain: Secondary | ICD-10-CM

## 2022-04-26 ENCOUNTER — Telehealth: Payer: Self-pay | Admitting: Orthopaedic Surgery

## 2022-04-26 DIAGNOSIS — G8929 Other chronic pain: Secondary | ICD-10-CM

## 2022-05-24 ENCOUNTER — Telehealth: Payer: Self-pay | Admitting: Orthopaedic Surgery

## 2022-05-24 DIAGNOSIS — G8929 Other chronic pain: Secondary | ICD-10-CM

## 2022-06-15 ENCOUNTER — Ambulatory Visit (INDEPENDENT_AMBULATORY_CARE_PROVIDER_SITE_OTHER): Payer: Medicare HMO | Admitting: Orthopaedic Surgery

## 2022-06-15 ENCOUNTER — Encounter: Payer: Self-pay | Admitting: Orthopaedic Surgery

## 2022-06-15 DIAGNOSIS — G8929 Other chronic pain: Secondary | ICD-10-CM | POA: Diagnosis not present

## 2022-06-15 DIAGNOSIS — M25562 Pain in left knee: Secondary | ICD-10-CM

## 2022-06-15 MED ORDER — METHYLPREDNISOLONE ACETATE 40 MG/ML IJ SUSP
40.0000 mg | Freq: Once | INTRAMUSCULAR | Status: AC
  Administered 2022-06-15: 40 mg via INTRA_ARTICULAR

## 2022-06-15 NOTE — Progress Notes (Signed)
PROCEDURE NOTE:  The patient requests injections of the left knee , verbal consent was obtained.  The left knee was prepped appropriately after time out was performed.   Sterile technique was observed and injection of 1 cc of DepoMedrol 40 mg with several cc's of plain xylocaine. Anesthesia was provided by ethyl chloride and a 20-gauge needle was used to inject the knee area. The injection was tolerated well.  A band aid dressing was applied.  The patient was advised to apply ice later today and tomorrow to the injection sight as needed.  Encounter Diagnosis  Name Primary?   Chronic pain of left knee Yes   Return prn.  Call if any problem.  Precautions discussed.  Electronically Signed Sanjuana Kava, MD 11/15/20238:14 AM

## 2022-06-27 ENCOUNTER — Telehealth: Payer: Self-pay | Admitting: Orthopaedic Surgery

## 2022-06-27 DIAGNOSIS — G8929 Other chronic pain: Secondary | ICD-10-CM

## 2022-07-26 ENCOUNTER — Other Ambulatory Visit: Payer: Self-pay | Admitting: Orthopaedic Surgery

## 2022-07-26 DIAGNOSIS — G8929 Other chronic pain: Secondary | ICD-10-CM

## 2022-08-24 ENCOUNTER — Other Ambulatory Visit: Payer: Self-pay | Admitting: Orthopaedic Surgery

## 2022-08-24 DIAGNOSIS — G8929 Other chronic pain: Secondary | ICD-10-CM

## 2022-08-31 ENCOUNTER — Encounter: Payer: Self-pay | Admitting: Orthopaedic Surgery

## 2022-08-31 ENCOUNTER — Ambulatory Visit (INDEPENDENT_AMBULATORY_CARE_PROVIDER_SITE_OTHER): Payer: Medicare HMO | Admitting: Orthopaedic Surgery

## 2022-08-31 DIAGNOSIS — M25562 Pain in left knee: Secondary | ICD-10-CM | POA: Diagnosis not present

## 2022-08-31 DIAGNOSIS — G8929 Other chronic pain: Secondary | ICD-10-CM | POA: Diagnosis not present

## 2022-08-31 MED ORDER — METHYLPREDNISOLONE ACETATE 40 MG/ML IJ SUSP
40.0000 mg | Freq: Once | INTRAMUSCULAR | Status: AC
  Administered 2022-08-31: 40 mg via INTRA_ARTICULAR

## 2022-08-31 MED ORDER — HYDROCODONE-ACETAMINOPHEN 5-325 MG PO TABS: ORAL_TABLET | ORAL | 0 refills | Status: DC

## 2022-08-31 NOTE — Progress Notes (Signed)
PROCEDURE NOTE:  The patient requests injections of the right knee , verbal consent was obtained.  The right knee was prepped appropriately after time out was performed.   Sterile technique was observed and injection of 1 cc of DepoMedrol '40mg'$  with several cc's of plain xylocaine. Anesthesia was provided by ethyl chloride and a 20-gauge needle was used to inject the knee area. The injection was tolerated well.  A band aid dressing was applied.  The patient was advised to apply ice later today and tomorrow to the injection sight as needed.  Encounter Diagnosis  Name Primary?   Chronic pain of left knee Yes   I have reviewed the [place] web site prior to prescribing narcotic medicine for this patient.  Return prn.  Call if any problem.  Precautions discussed.  Electronically Signed Sanjuana Kava, MD 1/31/20248:47 AM

## 2022-09-13 DIAGNOSIS — M79671 Pain in right foot: Secondary | ICD-10-CM | POA: Insufficient documentation

## 2022-09-13 DIAGNOSIS — M109 Gout, unspecified: Secondary | ICD-10-CM | POA: Insufficient documentation

## 2022-09-13 DIAGNOSIS — A609 Anogenital herpesviral infection, unspecified: Secondary | ICD-10-CM | POA: Insufficient documentation

## 2022-09-13 DIAGNOSIS — J019 Acute sinusitis, unspecified: Secondary | ICD-10-CM | POA: Insufficient documentation

## 2022-09-13 DIAGNOSIS — M25562 Pain in left knee: Secondary | ICD-10-CM | POA: Insufficient documentation

## 2022-09-13 DIAGNOSIS — I129 Hypertensive chronic kidney disease with stage 1 through stage 4 chronic kidney disease, or unspecified chronic kidney disease: Secondary | ICD-10-CM | POA: Insufficient documentation

## 2022-09-13 DIAGNOSIS — I10 Essential (primary) hypertension: Secondary | ICD-10-CM | POA: Insufficient documentation

## 2022-09-13 DIAGNOSIS — E785 Hyperlipidemia, unspecified: Secondary | ICD-10-CM | POA: Insufficient documentation

## 2022-09-13 DIAGNOSIS — R110 Nausea: Secondary | ICD-10-CM | POA: Insufficient documentation

## 2022-09-13 DIAGNOSIS — R5383 Other fatigue: Secondary | ICD-10-CM | POA: Insufficient documentation

## 2022-09-29 ENCOUNTER — Telehealth: Payer: Self-pay | Admitting: Orthopaedic Surgery

## 2022-09-29 DIAGNOSIS — G8929 Other chronic pain: Secondary | ICD-10-CM
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
## 2022-09-29 NOTE — Telephone Encounter (Signed)
Sending to provider, however, at the time this message was sent he was out of the office until Tuesday.

## 2022-10-03 NOTE — Telephone Encounter (Signed)
Patient came in the office wanting to know if she needed to make an appt to pick up a written script for her pain medicine.   I advised her no, but Dr. Luna Glasgow will be in first thing in the morning and to check back around 9:30 to see if it has been written yet.

## 2022-10-04 MED ORDER — HYDROCODONE-ACETAMINOPHEN 5-325 MG PO TABS: ORAL_TABLET | ORAL | 0 refills | Status: DC
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
## 2022-10-04 NOTE — Telephone Encounter (Signed)
Spoke with patient. Advised her that a written prescription for her pain medication is up front and she will need to pick it up. She verbalizes understanding.

## 2022-11-02 ENCOUNTER — Telehealth: Payer: Self-pay | Admitting: Orthopaedic Surgery

## 2022-11-02 DIAGNOSIS — G8929 Other chronic pain: Secondary | ICD-10-CM

## 2022-11-30 ENCOUNTER — Other Ambulatory Visit: Payer: Self-pay | Admitting: Orthopaedic Surgery

## 2022-11-30 DIAGNOSIS — M25562 Pain in left knee: Secondary | ICD-10-CM

## 2022-12-20 ENCOUNTER — Telehealth: Payer: Self-pay | Admitting: Orthopaedic Surgery

## 2022-12-20 NOTE — Telephone Encounter (Signed)
Dr. Hilda Lias - returned the patient's call, lvm for her to call us back.  She is wanting to schedule an appointment with Dr. Hilda Lias.

## 2022-12-27 ENCOUNTER — Telehealth: Payer: Self-pay | Admitting: Orthopaedic Surgery

## 2022-12-27 DIAGNOSIS — G8929 Other chronic pain: Secondary | ICD-10-CM

## 2023-01-10 ENCOUNTER — Encounter: Payer: Self-pay | Admitting: Orthopaedic Surgery

## 2023-01-10 ENCOUNTER — Ambulatory Visit: Payer: Medicare HMO | Admitting: Orthopaedic Surgery

## 2023-01-10 DIAGNOSIS — G8929 Other chronic pain: Secondary | ICD-10-CM

## 2023-01-10 MED ORDER — METHYLPREDNISOLONE ACETATE 40 MG/ML IJ SUSP: 40.0000 mg | Freq: Once | INTRAMUSCULAR | Status: AC

## 2023-01-10 NOTE — Progress Notes (Signed)
PROCEDURE NOTE:  The patient requests injections of the left knee , verbal consent was obtained.  The left knee was prepped appropriately after time out was performed.   Sterile technique was observed and injection of 1 cc of DepoMedrol 40 mg with several cc's of plain xylocaine. Anesthesia was provided by ethyl chloride and a 20-gauge needle was used to inject the knee area. The injection was tolerated well.  A band aid dressing was applied.  The patient was advised to apply ice later today and tomorrow to the injection sight as needed.  Encounter Diagnosis  Name Primary?   Chronic pain of left knee Yes   Return prn.  Call if any problem.  Precautions discussed.  Electronically Signed Darreld Mclean, MD 6/11/20241:31 PM

## 2023-01-30 ENCOUNTER — Telehealth: Payer: Self-pay | Admitting: Orthopaedic Surgery

## 2023-01-30 DIAGNOSIS — G8929 Other chronic pain: Secondary | ICD-10-CM

## 2023-02-27 ENCOUNTER — Telehealth: Payer: Self-pay | Admitting: Orthopaedic Surgery

## 2023-02-27 DIAGNOSIS — G8929 Other chronic pain: Secondary | ICD-10-CM

## 2023-03-28 ENCOUNTER — Telehealth: Payer: Self-pay | Admitting: Orthopaedic Surgery

## 2023-03-28 DIAGNOSIS — G8929 Other chronic pain: Secondary | ICD-10-CM

## 2023-05-01 ENCOUNTER — Telehealth: Payer: Self-pay | Admitting: Orthopaedic Surgery

## 2023-05-01 DIAGNOSIS — G8929 Other chronic pain: Secondary | ICD-10-CM

## 2023-05-03 ENCOUNTER — Other Ambulatory Visit (HOSPITAL_COMMUNITY): Payer: Self-pay | Admitting: Family Medicine

## 2023-05-03 DIAGNOSIS — Z13820 Encounter for screening for osteoporosis: Secondary | ICD-10-CM

## 2023-05-03 DIAGNOSIS — Z1231 Encounter for screening mammogram for malignant neoplasm of breast: Secondary | ICD-10-CM

## 2023-05-05 ENCOUNTER — Emergency Department (HOSPITAL_COMMUNITY)
Admission: EM | Admit: 2023-05-05 | Discharge: 2023-05-06 | Disposition: A | Discharge status: Home / Self Care | Payer: Medicare HMO | Attending: Emergency Medicine | Admitting: Emergency Medicine

## 2023-05-05 ENCOUNTER — Emergency Department (HOSPITAL_COMMUNITY): Payer: Medicare HMO

## 2023-05-05 ENCOUNTER — Other Ambulatory Visit: Payer: Self-pay

## 2023-05-05 DIAGNOSIS — R42 Dizziness and giddiness: Secondary | ICD-10-CM | POA: Insufficient documentation

## 2023-05-05 DIAGNOSIS — I10 Essential (primary) hypertension: Secondary | ICD-10-CM | POA: Insufficient documentation

## 2023-05-05 DIAGNOSIS — Z79899 Other long term (current) drug therapy: Secondary | ICD-10-CM | POA: Diagnosis not present

## 2023-05-05 DIAGNOSIS — E876 Hypokalemia: Secondary | ICD-10-CM | POA: Insufficient documentation

## 2023-05-05 DIAGNOSIS — D649 Anemia, unspecified: Secondary | ICD-10-CM | POA: Insufficient documentation

## 2023-05-05 LAB — BASIC METABOLIC PANEL WITH GFR
Anion gap: 10 (ref 5–15)
BUN: 14 mg/dL (ref 8–23)
CO2: 26 mmol/L (ref 22–32)
Calcium: 8.6 mg/dL — ABNORMAL LOW (ref 8.9–10.3)
Chloride: 102 mmol/L (ref 98–111)
Creatinine, Ser: 1.16 mg/dL — ABNORMAL HIGH (ref 0.44–1.00)
GFR, Estimated: 51 mL/min — ABNORMAL LOW
Glucose, Bld: 110 mg/dL — ABNORMAL HIGH (ref 70–99)
Potassium: 2.8 mmol/L — ABNORMAL LOW (ref 3.5–5.1)
Sodium: 138 mmol/L (ref 135–145)

## 2023-05-05 LAB — CBC
HCT: 34.9 % — ABNORMAL LOW (ref 36.0–46.0)
Hemoglobin: 11.2 g/dL — ABNORMAL LOW (ref 12.0–15.0)
MCH: 28.4 pg (ref 26.0–34.0)
MCHC: 32.1 g/dL (ref 30.0–36.0)
MCV: 88.6 fL (ref 80.0–100.0)
Platelets: 194 10*3/uL (ref 150–400)
RBC: 3.94 MIL/uL (ref 3.87–5.11)
RDW: 13.1 % (ref 11.5–15.5)
WBC: 5.2 10*3/uL (ref 4.0–10.5)
nRBC: 0 % (ref 0.0–0.2)

## 2023-05-05 LAB — CBG MONITORING, ED: Glucose-Capillary: 111 mg/dL — ABNORMAL HIGH (ref 70–99)

## 2023-05-05 MED ORDER — POTASSIUM CHLORIDE 20 MEQ PO PACK
40.0000 meq | PACK | Freq: Once | ORAL | Status: AC
  Administered 2023-05-05: 40 meq via ORAL
  Filled 2023-05-05: qty 2

## 2023-05-05 MED ORDER — ACETAMINOPHEN 325 MG PO TABS
650.0000 mg | ORAL_TABLET | Freq: Once | ORAL | Status: AC
  Administered 2023-05-05: 650 mg via ORAL
  Filled 2023-05-05: qty 2

## 2023-05-05 MED ORDER — MECLIZINE HCL 12.5 MG PO TABS
12.5000 mg | ORAL_TABLET | Freq: Once | ORAL | Status: AC
  Administered 2023-05-05: 12.5 mg via ORAL
  Filled 2023-05-05: qty 1

## 2023-05-05 NOTE — ED Provider Notes (Signed)
I provided a substantive portion of the care of this patient.  I personally made/approved the management plan for this patient and take responsibility for the patient management.   Dementia and vertigo. Here with vertigo and frontal headache. Ct ok. Had meclizine. Needs follow up for symptoms.   EKG Interpretation Date/Time:  Friday May 05 2023 22:33:59 EDT Ventricular Rate:  67 PR Interval:  159 QRS Duration:  87 QT Interval:  425 QTC Calculation: 449 R Axis:   48  Text Interpretation: Sinus rhythm No significant change since prior 9/22 Confirmed by Meridee Score [phone]) on 05/05/2023 10:38:28 PM

## 2023-05-05 NOTE — ED Triage Notes (Signed)
Pt presents to ED stating that she feels Dizzy. Dizziness started about 20 min PTA and when she got up from laying down. Denies blurred vision, nausea and vomiting.

## 2023-05-05 NOTE — ED Provider Notes (Signed)
[place] EMERGENCY DEPARTMENT AT [facility] Provider Note   CSN: [id number] Arrival date & time: 05/05/23  1952     History  Chief Complaint  Patient presents with   Dizziness    Pt presents to ED stating that she feels Dizzy. Dizziness started about 20 min PTA and when she got up from laying down. Denies blurred vision, nausea and vomiting.    Emmalie Haigh Lill is a 72 y.o. female past medical history seen for hypertension, chronic back pain, vertigo, GERD who presents with concern for sudden onset dizziness after getting up from the couch.  Patient reports that it lasted for around an hour, was starting to improve at time of my evaluation but she endorsed some frontal headache.  She is not sure if this felt similar to her normal vertigo or not.  She denies any head injury, previous stroke.  She denies any numbness, tingling of arms, legs, she denies any fever, chills, dysuria.   Dizziness      Home Medications Prior to Admission medications   Medication Sig Start Date End Date Taking? Authorizing Provider  acetaminophen (TYLENOL) 325 MG tablet Take 650 mg by mouth every 6 (six) hours as needed for pain.    [provider]  HYDROcodone-acetaminophen (NORCO/VICODIN) 5-325 MG tablet TAKE ONE TABLET BY MOUTH EVERY SIX HOURSAS NEEDED FOR PAIN MAY CAUSE DROWSINESS 05/01/23   Darreld Mclean, MD  lisinopril-hydrochlorothiazide (PRINZIDE,ZESTORETIC) 10-12.5 MG per tablet Take 1 tablet by mouth daily.     [provider]  meclizine (ANTIVERT) 25 MG tablet Take 1 tablet (25 mg total) by mouth 3 (three) times daily as needed for dizziness. 04/20/21   Rancour, Jeannett Senior, MD  methocarbamol (ROBAXIN) 500 MG tablet Take 1 tablet (500 mg total) by mouth every 8 (eight) hours as needed for muscle spasms. 05/22/19   Loren Racer, MD  potassium chloride SA (KLOR-CON) 20 MEQ tablet Take 1 tablet (20 mEq total) by mouth 2 (two) times daily. 04/20/21   Rancour, Jeannett Senior, MD   simvastatin (ZOCOR) 20 MG tablet Take 20 mg by mouth daily. 12/14/22   [provider]  zolpidem (AMBIEN) 10 MG tablet Take 10 mg by mouth at bedtime.  01/29/18   [provider]      Allergies    Darvocet [propoxyphene n-acetaminophen] and Toradol [ketorolac tromethamine]    Review of Systems   Review of Systems  Neurological:  Positive for dizziness.  All other systems reviewed and are negative.   Physical Exam Updated Vital Signs BP (!) 151/89   Pulse 64   Temp 98.3 F (36.8 C)   Resp 18   Ht 5\' 5"  (1.651 m)   Wt 92.1 kg   SpO2 97%   BMI 33.79 kg/m  Physical Exam Vitals and nursing note reviewed.  Constitutional:      General: She is not in acute distress.    Appearance: Normal appearance.  HENT:     Head: Normocephalic and atraumatic.  Eyes:     General:        Right eye: No discharge.        Left eye: No discharge.  Cardiovascular:     Rate and Rhythm: Normal rate and regular rhythm.     Heart sounds: No murmur heard.    No friction rub. No gallop.  Pulmonary:     Effort: Pulmonary effort is normal.     Breath sounds: Normal breath sounds.  Abdominal:     General: Bowel sounds are  normal.     Palpations: Abdomen is soft.  Skin:    General: Skin is warm and dry.     Capillary Refill: Capillary refill takes less than 2 seconds.  Neurological:     Mental Status: She is alert and oriented to person, place, and time.     Comments: Cranial nerves II through XII grossly intact.  Intact finger-nose, intact heel-to-shin.  Romberg negative, gait normal.  Alert and oriented x3.  Moves all 4 limbs spontaneously, normal coordination.  No pronator drift.  Intact strength 5 out of 5 bilateral upper and lower extremities.   Psychiatric:        Mood and Affect: Mood normal.        Behavior: Behavior normal.     ED Results / Procedures / Treatments   Labs (all labs ordered are listed, but only abnormal results are displayed) Labs Reviewed  CBC -  Abnormal; Notable for the following components:      Result Value   Hemoglobin 11.2 (*)    HCT 34.9 (*)    All other components within normal limits  BASIC METABOLIC PANEL - Abnormal; Notable for the following components:   Potassium 2.8 (*)    Glucose, Bld 110 (*)    Creatinine, Ser 1.16 (*)    Calcium 8.6 (*)    GFR, Estimated 51 (*)    All other components within normal limits  CBG MONITORING, ED - Abnormal; Notable for the following components:   Glucose-Capillary 111 (*)    All other components within normal limits    EKG EKG Interpretation Date/Time:  Friday May 05 2023 22:33:59 EDT Ventricular Rate:  67 PR Interval:  159 QRS Duration:  87 QT Interval:  425 QTC Calculation: 449 R Axis:   48  Text Interpretation: Sinus rhythm No significant change since prior 9/22 Confirmed by Meridee Score [phone]) on 05/05/2023 10:38:28 PM  Radiology CT Head Wo Contrast  Result Date: 05/05/2023 CLINICAL DATA:  Headache and dizziness EXAM: CT HEAD WITHOUT CONTRAST TECHNIQUE: Contiguous axial images were obtained from the base of the skull through the vertex without intravenous contrast. RADIATION DOSE REDUCTION: This exam was performed according to the departmental dose-optimization program which includes automated exposure control, adjustment of the mA and/or kV according to patient size and/or use of iterative reconstruction technique. COMPARISON:  04/20/2021 FINDINGS: Brain: No mass,hemorrhage or extra-axial collection. Normal appearance of the parenchyma and CSF spaces. Vascular: No hyperdense vessel or unexpected vascular calcification. Skull: The visualized skull base, calvarium and extracranial soft tissues are normal. Sinuses/Orbits: No fluid levels or advanced mucosal thickening of the visualized paranasal sinuses. No mastoid or middle ear effusion. Normal orbits. IMPRESSION: Normal head CT. Electronically Signed   By: Deatra Robinson M.D.   On: 05/05/2023 23:26     Procedures Procedures    Medications Ordered in ED Medications  acetaminophen (TYLENOL) tablet 650 mg (650 mg Oral Given 05/05/23 2151)  meclizine (ANTIVERT) tablet 12.5 mg (12.5 mg Oral Given 05/05/23 2151)  potassium chloride (KLOR-CON) packet 40 mEq (40 mEq Oral Given 05/05/23 2317)    ED Course/ Medical Decision Making/ A&P                                 Medical Decision Making Amount and/or Complexity of Data Reviewed Labs: ordered. Radiology: ordered.  Risk OTC drugs. Prescription drug management.   This patient is a 72 y.o. female  who presents to  the ED for concern of dizziness, mild headache.   Differential diagnoses prior to evaluation: The emergent differential diagnosis includes, but is not limited to,  BPPV, vestibular migraine, head trauma, AVM, intracranial tumor, multiple sclerosis, drug-related, CVA, vasovagal syncope, orthostatic hypotension, sepsis, hypoglycemia, electrolyte disturbance, anemia, anxiety/panic attack . This is not an exhaustive differential.   Past Medical History / Co-morbidities / Social History: Hypertension, ago, osteoporosis, acid reflux  Physical Exam: Physical exam performed. The pertinent findings include: No focal neurologic deficits, somewhat hypertensive on arrival, blood pressure 181/81.  Improved after resting in the ED, repeat BP 139/80, 151/89.  Stable oxygen saturation on room air, otherwise well-appearing  Lab Tests/Imaging studies: I personally interpreted labs/imaging and the pertinent results include: CBC notable for mild anemia, hemoglobin 1.2.  Her BMP notable for moderate hypokalemia, testing 2.8.  We will orally replete.  She has been chronically low in the past, encourage oral repletion and close PCP follow-up.  Creatinine elevated 1.16.  I independently interpreted CT of the head which shows no evidence of intracranial abnormality, pending radiologist interpretation at this time.   Cardiac monitoring: EKG obtained  and interpreted by myself and attending physician which shows: NSR, no significant change from baseline   Medications: I ordered medication including Tylenol, Antivert, potassium chloride.  I have reviewed the patients home medicines and have made adjustments as needed.   11:30 PM Care of Teofila Bowery Petersheim transferred to Dr. Clayborne Dana at the end of my shift as the patient will require reassessment once labs/imaging have resulted. Patient presentation, ED course, and plan of care discussed with review of all pertinent labs and imaging. Please see his/her note for further details regarding further ED course and disposition. Plan at time of handoff is pending reassessment, CT head, likely stable for discharge, needs PCP follow-up for potassium recheck. This may be altered or completely changed at the discretion of the oncoming team pending results of further workup.  Final Clinical Impression(s) / ED Diagnoses Final diagnoses:  None    Rx / DC Orders ED Discharge Orders     None         [place] 05/05/23 2330    Terrilee Files, MD 05/06/23 1009

## 2023-05-06 MED ORDER — MECLIZINE HCL 12.5 MG PO TABS: 12.5000 mg | ORAL_TABLET | Freq: Three times a day (TID) | ORAL | 0 refills | Status: AC | PRN

## 2023-05-06 NOTE — ED Notes (Signed)
Pt ambulated in hallway without assistance. Denies dizziness. Pt states she is "ready to go".

## 2023-05-17 ENCOUNTER — Ambulatory Visit (HOSPITAL_COMMUNITY)
Admission: RE | Admit: 2023-05-17 | Discharge: 2023-05-17 | Disposition: A | Discharge status: Home / Self Care | Payer: Medicare HMO | Source: Ambulatory Visit | Attending: Family Medicine | Admitting: Family Medicine

## 2023-05-17 ENCOUNTER — Encounter (HOSPITAL_COMMUNITY): Payer: Self-pay

## 2023-05-17 DIAGNOSIS — Z13820 Encounter for screening for osteoporosis: Secondary | ICD-10-CM | POA: Insufficient documentation

## 2023-05-17 DIAGNOSIS — M810 Age-related osteoporosis without current pathological fracture: Secondary | ICD-10-CM | POA: Diagnosis not present

## 2023-05-17 DIAGNOSIS — Z1231 Encounter for screening mammogram for malignant neoplasm of breast: Secondary | ICD-10-CM | POA: Diagnosis present

## 2023-05-23 ENCOUNTER — Ambulatory Visit (INDEPENDENT_AMBULATORY_CARE_PROVIDER_SITE_OTHER): Payer: Medicare HMO | Admitting: Orthopaedic Surgery

## 2023-05-23 ENCOUNTER — Encounter: Payer: Self-pay | Admitting: Orthopaedic Surgery

## 2023-05-23 DIAGNOSIS — G8929 Other chronic pain: Secondary | ICD-10-CM | POA: Diagnosis not present

## 2023-05-23 DIAGNOSIS — M25562 Pain in left knee: Secondary | ICD-10-CM

## 2023-05-23 MED ORDER — METHYLPREDNISOLONE ACETATE 40 MG/ML IJ SUSP
40.0000 mg | Freq: Once | INTRAMUSCULAR | Status: AC
Start: 2023-05-23 — End: 2023-05-23
  Administered 2023-05-23: 40 mg via INTRA_ARTICULAR

## 2023-05-23 MED ORDER — HYDROCODONE-ACETAMINOPHEN 5-325 MG PO TABS: ORAL_TABLET | ORAL | 0 refills | Status: DC

## 2023-05-23 NOTE — Progress Notes (Signed)
PROCEDURE NOTE:  The patient requests injections of the left knee , verbal consent was obtained.  The left knee was prepped appropriately after time out was performed.   Sterile technique was observed and injection of 1 cc of DepoMedrol 40 mg with several cc's of plain xylocaine. Anesthesia was provided by ethyl chloride and a 20-gauge needle was used to inject the knee area. The injection was tolerated well.  A band aid dressing was applied.  The patient was advised to apply ice later today and tomorrow to the injection sight as needed.  Encounter Diagnosis  Name Primary?   Chronic pain of left knee Yes   Return prn.  Call if any problem.  Precautions discussed.  Electronically Signed Darreld Mclean, MD 10/22/20249:11 AM

## 2023-06-20 ENCOUNTER — Telehealth: Payer: Self-pay | Admitting: Orthopaedic Surgery

## 2023-06-20 DIAGNOSIS — G8929 Other chronic pain: Secondary | ICD-10-CM

## 2023-07-18 ENCOUNTER — Telehealth: Payer: Self-pay | Admitting: Orthopaedic Surgery

## 2023-07-18 DIAGNOSIS — G8929 Other chronic pain: Secondary | ICD-10-CM

## 2023-08-18 ENCOUNTER — Telehealth: Payer: Self-pay | Admitting: Orthopaedic Surgery

## 2023-08-18 DIAGNOSIS — G8929 Other chronic pain: Secondary | ICD-10-CM

## 2023-08-23 ENCOUNTER — Ambulatory Visit (INDEPENDENT_AMBULATORY_CARE_PROVIDER_SITE_OTHER): Payer: Medicare HMO | Admitting: Orthopaedic Surgery

## 2023-08-23 ENCOUNTER — Encounter: Payer: Self-pay | Admitting: Orthopaedic Surgery

## 2023-08-23 DIAGNOSIS — M25562 Pain in left knee: Secondary | ICD-10-CM

## 2023-08-23 DIAGNOSIS — G8929 Other chronic pain: Secondary | ICD-10-CM

## 2023-08-23 MED ORDER — METHYLPREDNISOLONE ACETATE 40 MG/ML IJ SUSP
40.0000 mg | Freq: Once | INTRAMUSCULAR | Status: AC
Start: 2023-08-23 — End: 2023-08-23
  Administered 2023-08-23: 40 mg via INTRA_ARTICULAR

## 2023-08-23 NOTE — Progress Notes (Signed)
PROCEDURE NOTE:  The patient requests injections of the left knee , verbal consent was obtained.  The left knee was prepped appropriately after time out was performed.   Sterile technique was observed and injection of 1 cc of DepoMedrol 40 mg with several cc's of plain xylocaine. Anesthesia was provided by ethyl chloride and a 20-gauge needle was used to inject the knee area. The injection was tolerated well.  A band aid dressing was applied.  The patient was advised to apply ice later today and tomorrow to the injection sight as needed.  Encounter Diagnosis  Name Primary?   Chronic pain of left knee Yes   Return prn  Call if any problem.  Precautions discussed.  Electronically Signed Darreld Mclean, MD 1/22/20258:57 AM

## 2023-09-19 ENCOUNTER — Other Ambulatory Visit (HOSPITAL_COMMUNITY): Payer: Self-pay

## 2023-09-19 ENCOUNTER — Ambulatory Visit (HOSPITAL_COMMUNITY)
Admission: RE | Admit: 2023-09-19 | Discharge: 2023-09-19 | Disposition: A | Discharge status: Home / Self Care | Payer: Medicare HMO | Source: Ambulatory Visit

## 2023-09-19 ENCOUNTER — Telehealth: Payer: Self-pay | Admitting: Orthopaedic Surgery

## 2023-09-19 DIAGNOSIS — J988 Other specified respiratory disorders: Secondary | ICD-10-CM

## 2023-09-19 DIAGNOSIS — G8929 Other chronic pain: Secondary | ICD-10-CM

## 2023-10-17 ENCOUNTER — Telehealth: Payer: Self-pay | Admitting: Orthopaedic Surgery

## 2023-10-17 DIAGNOSIS — G8929 Other chronic pain: Secondary | ICD-10-CM

## 2023-11-15 ENCOUNTER — Telehealth: Payer: Self-pay | Admitting: Orthopaedic Surgery

## 2023-11-15 DIAGNOSIS — G8929 Other chronic pain: Secondary | ICD-10-CM

## 2023-11-29 ENCOUNTER — Other Ambulatory Visit: Payer: Self-pay | Admitting: Orthopaedic Surgery

## 2023-11-29 DIAGNOSIS — G8929 Other chronic pain: Secondary | ICD-10-CM

## 2023-12-13 ENCOUNTER — Encounter: Payer: Self-pay | Admitting: Orthopaedic Surgery

## 2023-12-13 ENCOUNTER — Ambulatory Visit (INDEPENDENT_AMBULATORY_CARE_PROVIDER_SITE_OTHER): Admitting: Orthopaedic Surgery

## 2023-12-13 DIAGNOSIS — M25562 Pain in left knee: Secondary | ICD-10-CM

## 2023-12-13 DIAGNOSIS — G8929 Other chronic pain: Secondary | ICD-10-CM

## 2023-12-13 MED ORDER — METHYLPREDNISOLONE ACETATE 40 MG/ML IJ SUSP
40.0000 mg | Freq: Once | INTRAMUSCULAR | Status: AC
Start: 2023-12-13 — End: 2023-12-13
  Administered 2023-12-13: 40 mg via INTRA_ARTICULAR

## 2023-12-13 MED ORDER — HYDROCODONE-ACETAMINOPHEN 5-325 MG PO TABS
ORAL_TABLET | ORAL | 0 refills | Status: DC
Start: 2023-12-13 — End: 2024-01-10

## 2023-12-13 NOTE — Addendum Note (Signed)
 Addended by: [place] Snow T on: 12/13/2023 11:44 AM   Modules accepted: Orders

## 2023-12-13 NOTE — Progress Notes (Signed)
 PROCEDURE NOTE:  The patient requests injections of the left knee , verbal consent was obtained.  The left knee was prepped appropriately after time out was performed.   Sterile technique was observed and injection of 1 cc of DepoMedrol 40 mg with several cc's of plain xylocaine . Anesthesia was provided by ethyl chloride and a 20-gauge needle was used to inject the knee area. The injection was tolerated well.  A band aid dressing was applied.  The patient was advised to apply ice later today and tomorrow to the injection sight as needed.  Encounter Diagnosis  Name Primary?   Chronic pain of left knee Yes   I have reviewed the [place]  Controlled Substance Reporting System web site prior to prescribing narcotic medicine for this patient.  Return prn.  Call if any problem.  Precautions discussed.  Electronically Signed Pleasant Brilliant, MD 5/14/202510:00 AM

## 2024-01-10 ENCOUNTER — Telehealth: Payer: Self-pay | Admitting: Orthopaedic Surgery

## 2024-01-10 DIAGNOSIS — G8929 Other chronic pain: Secondary | ICD-10-CM

## 2024-02-13 ENCOUNTER — Telehealth: Payer: Self-pay | Admitting: Orthopaedic Surgery

## 2024-02-13 DIAGNOSIS — G8929 Other chronic pain: Secondary | ICD-10-CM

## 2024-03-06 ENCOUNTER — Telehealth: Payer: Self-pay | Admitting: Orthopaedic Surgery

## 2024-03-06 DIAGNOSIS — G8929 Other chronic pain: Secondary | ICD-10-CM

## 2024-04-03 ENCOUNTER — Telehealth: Payer: Self-pay | Admitting: Orthopaedic Surgery

## 2024-04-03 DIAGNOSIS — G8929 Other chronic pain: Secondary | ICD-10-CM

## 2024-04-24 ENCOUNTER — Ambulatory Visit (INDEPENDENT_AMBULATORY_CARE_PROVIDER_SITE_OTHER): Admitting: Orthopaedic Surgery

## 2024-04-24 ENCOUNTER — Encounter: Payer: Self-pay | Admitting: Orthopaedic Surgery

## 2024-04-24 DIAGNOSIS — M25562 Pain in left knee: Secondary | ICD-10-CM | POA: Diagnosis not present

## 2024-04-24 DIAGNOSIS — G8929 Other chronic pain: Secondary | ICD-10-CM

## 2024-04-24 MED ORDER — HYDROCODONE-ACETAMINOPHEN 5-325 MG PO TABS: ORAL_TABLET | ORAL | 0 refills | Status: DC

## 2024-04-24 NOTE — Progress Notes (Signed)
 PROCEDURE NOTE:  The patient requests injections of the left knee , verbal consent was obtained.  The left knee was prepped appropriately after time out was performed.   Sterile technique was observed and injection of 1 cc of DepoMedrol 40 mg with several cc's of plain xylocaine . Anesthesia was provided by ethyl chloride and a 20-gauge needle was used to inject the knee area. The injection was tolerated well.  A band aid dressing was applied.  The patient was advised to apply ice later today and tomorrow to the injection sight as needed.  Encounter Diagnosis  Name Primary?   Chronic pain of left knee Yes   Return in six weeks.  I have informed the patient I will be retiring from medical practice and from this office on May 02, 2024.  The patient has been offered continuing care with Dr. Margrette or Dr. Onesimo of this office.  The patient may choose another provider and the records will be forwarded after proper signature and notification.  Patient understands and agrees.  She would like to see Dr. Margrette.  I have reviewed the [place]  Controlled Substance Reporting System web site prior to prescribing narcotic medicine for this patient.  Call if any problem.  Precautions discussed.  Electronically Signed Lemond Stable, MD 9/24/20252:40 PM

## 2024-05-01 ENCOUNTER — Telehealth: Payer: Self-pay | Admitting: Orthopaedic Surgery

## 2024-05-01 DIAGNOSIS — G8929 Other chronic pain: Secondary | ICD-10-CM

## 2024-05-14 ENCOUNTER — Emergency Department (HOSPITAL_COMMUNITY)

## 2024-05-14 ENCOUNTER — Other Ambulatory Visit: Payer: Self-pay

## 2024-05-14 ENCOUNTER — Encounter (HOSPITAL_COMMUNITY): Payer: Self-pay | Admitting: Emergency Medicine

## 2024-05-14 ENCOUNTER — Emergency Department (HOSPITAL_COMMUNITY)
Admission: EM | Admit: 2024-05-14 | Discharge: 2024-05-14 | Disposition: A | Discharge status: Home / Self Care | Attending: Emergency Medicine | Admitting: Emergency Medicine

## 2024-05-14 DIAGNOSIS — M109 Gout, unspecified: Secondary | ICD-10-CM | POA: Diagnosis not present

## 2024-05-14 DIAGNOSIS — Z79899 Other long term (current) drug therapy: Secondary | ICD-10-CM | POA: Insufficient documentation

## 2024-05-14 DIAGNOSIS — I10 Essential (primary) hypertension: Secondary | ICD-10-CM | POA: Diagnosis not present

## 2024-05-14 DIAGNOSIS — M25571 Pain in right ankle and joints of right foot: Secondary | ICD-10-CM | POA: Diagnosis present

## 2024-05-14 MED ORDER — OXYCODONE-ACETAMINOPHEN 5-325 MG PO TABS
1.0000 | ORAL_TABLET | Freq: Once | ORAL | Status: AC
  Administered 2024-05-14: 1 via ORAL
  Filled 2024-05-14: qty 1

## 2024-05-14 MED ORDER — ONDANSETRON 4 MG PO TBDP
4.0000 mg | ORAL_TABLET | Freq: Once | ORAL | Status: AC
  Administered 2024-05-14: 4 mg via ORAL
  Filled 2024-05-14: qty 1

## 2024-05-14 MED ORDER — PREDNISONE 20 MG PO TABS: ORAL_TABLET | ORAL | 0 refills | Status: AC

## 2024-05-14 MED ORDER — OXYCODONE-ACETAMINOPHEN 5-325 MG PO TABS
1.0000 | ORAL_TABLET | Freq: Four times a day (QID) | ORAL | 0 refills | Status: AC | PRN
Start: 2024-05-14 — End: ?

## 2024-05-14 MED ORDER — DEXAMETHASONE SOD PHOSPHATE PF 10 MG/ML IJ SOLN
10.0000 mg | Freq: Once | INTRAMUSCULAR | Status: AC
  Administered 2024-05-14: 10 mg via INTRAMUSCULAR

## 2024-05-14 MED ORDER — COLCHICINE 0.6 MG PO TABS: ORAL_TABLET | ORAL | 0 refills | Status: AC

## 2024-05-14 NOTE — ED Triage Notes (Signed)
 Pt in by pov c/o of right ankle pain/ tenderness that started last night around 1700. Denies any injury.

## 2024-05-14 NOTE — ED Provider Notes (Signed)
 [place] [facility] AT [facility] Provider Note   CSN: [id number] Arrival date & time: 05/14/24  9288     Patient presents with: Ankle Pain   Megan Barber is a 73 y.o. female.   Pt is a 73 yo female with pmhx significant for htn, chronic back pain (on hydrocodone ), gerd, and osteoporosis.  Pt said she developed right ankle pain last night around 1700.  No injury.  She has had gout several years ago and feels like this is the same.       Prior to Admission medications   Medication Sig Start Date End Date Taking? Authorizing Provider  colchicine 0.6 MG tablet Take 2 tablets (1.2 mg total) by mouth daily for 1 day, THEN 1 tablet (0.6 mg total) daily for 10 days. 05/14/24 05/25/24 Yes Dean Clarity, MD  HYDROcodone -acetaminophen  (NORCO/VICODIN) 5-325 MG tablet TAKE ONE TABLET BY MOUTH EVERY SIX HOURSAS NEEDED FOR PAIN MAY CAUSE DROWSINESS 05/01/24   Brenna Lin, MD  oxyCODONE -acetaminophen  (PERCOCET/ROXICET) 5-325 MG tablet Take 1 tablet by mouth every 6 (six) hours as needed for severe pain (pain score 7-10). 05/14/24  Yes Dean Clarity, MD  predniSONE  (DELTASONE ) 20 MG tablet Take 2 tablets (40 mg total) by mouth daily with breakfast for 4 days, THEN 1 tablet (20 mg total) daily with breakfast for 4 days, THEN 0.5 tablets (10 mg total) daily with breakfast for 4 days. 05/14/24 05/26/24 Yes Dean Clarity, MD  acetaminophen  (TYLENOL ) 325 MG tablet Take 650 mg by mouth every 6 (six) hours as needed for pain.    [provider]  lisinopril-hydrochlorothiazide (PRINZIDE,ZESTORETIC) 10-12.5 MG per tablet Take 1 tablet by mouth daily.     [provider]  meclizine  (ANTIVERT ) 12.5 MG tablet Take 1 tablet (12.5 mg total) by mouth 3 (three) times daily as needed for dizziness. 05/06/23   Mesner, Selinda, MD  methocarbamol  (ROBAXIN ) 500 MG tablet Take 1 tablet (500 mg total) by mouth every 8 (eight) hours as needed for muscle spasms. 05/22/19    Carlyle Lenis, MD  potassium chloride  SA (KLOR-CON ) 20 MEQ tablet Take 1 tablet (20 mEq total) by mouth 2 (two) times daily. 04/20/21   Rancour, Garnette, MD  simvastatin (ZOCOR) 20 MG tablet Take 20 mg by mouth daily. 12/14/22   [provider]  zolpidem (AMBIEN) 10 MG tablet Take 10 mg by mouth at bedtime.  01/29/18   [provider]    Allergies: Darvocet [propoxyphene n-acetaminophen ] and Toradol [ketorolac tromethamine]    Review of Systems  Musculoskeletal:        Right ankle pain  All other systems reviewed and are negative.   Updated Vital Signs BP 123/61 (BP Location: Left Arm)   Pulse 70   Temp 97.6 F (36.4 C) (Oral)   Resp 18   Ht 5' 5 (1.651 m)   Wt 95 kg   SpO2 98%   BMI 34.85 kg/m   Physical Exam Vitals and nursing note reviewed.  Constitutional:      Appearance: Normal appearance.  HENT:     Head: Normocephalic and atraumatic.     Right Ear: External ear normal.     Left Ear: External ear normal.     Nose: Nose normal.     Mouth/Throat:     Mouth: Mucous membranes are moist.     Pharynx: Oropharynx is clear.  Eyes:     Extraocular Movements: Extraocular movements intact.     Conjunctiva/sclera: Conjunctivae normal.  Pupils: Pupils are equal, round, and reactive to light.  Cardiovascular:     Rate and Rhythm: Normal rate and regular rhythm.     Pulses: Normal pulses.     Heart sounds: Normal heart sounds.  Pulmonary:     Effort: Pulmonary effort is normal.     Breath sounds: Normal breath sounds.  Abdominal:     General: Abdomen is flat. Bowel sounds are normal.     Palpations: Abdomen is soft.  Musculoskeletal:     Cervical back: Normal range of motion and neck supple.     Right ankle: Swelling present.  Skin:    General: Skin is warm.     Capillary Refill: Capillary refill takes less than 2 seconds.  Neurological:     General: No focal deficit present.     Mental Status: She is alert and oriented to person, place, and  time.  Psychiatric:        Mood and Affect: Mood normal.        Behavior: Behavior normal.     (all labs ordered are listed, but only abnormal results are displayed) Labs Reviewed - No data to display  EKG: None  Radiology: DG Ankle Complete Right Result Date: 05/14/2024 EXAM: 3 OR MORE VIEW(S) XRAY OF THE RIGHT ANKLE 05/14/2024 07:48:00 AM CLINICAL HISTORY: pain. Pt in by pov c/o of right ankle pain/ tenderness that started last night around 1700. Denies any injury. COMPARISON: None available. FINDINGS: BONES AND JOINTS: Osteopenia with no evidence of fracture or dislocation. No joint effusion is seen. There is slight spurring of both malleolar undersurfaces. The ankle mortise is symmetric. There is moderate dorsal and plantar calcaneal spurring. SOFT TISSUES: There is mild generalized soft tissue swelling, greater laterally. IMPRESSION: 1. No acute osseous abnormality. 2. Mild generalized soft tissue swelling, greater laterally. 3. Osteopenia and degenerative change, calcaneal spurring. Electronically signed by: Francis Quam MD 05/14/2024 07:56 AM EDT RP Workstation: [id number]     Procedures   Medications Ordered in the ED  dexamethasone  (DECADRON ) injection 10 mg (10 mg Intramuscular Given 05/14/24 0751)  oxyCODONE -acetaminophen  (PERCOCET/ROXICET) 5-325 MG per tablet 1 tablet (1 tablet Oral Given 05/14/24 0752)  ondansetron  (ZOFRAN -ODT) disintegrating tablet 4 mg (4 mg Oral Given 05/14/24 0753)                                    Medical Decision Making Amount and/or Complexity of Data Reviewed Radiology: ordered.  Risk Prescription drug management.   This patient presents to the ED for concern of ankle pain, this involves an extensive number of treatment options, and is a complaint that carries with it a high risk of complications and morbidity.  The differential diagnosis includes sprain, fx, gout   Co morbidities that complicate the patient evaluation  htn, chronic  back pain (on hydrocodone ), gerd, and osteoporosis   Additional history obtained:  Additional history obtained from epic chart review Imaging Studies ordered:  I ordered imaging studies including right ankle  I independently visualized and interpreted imaging which showed   No acute osseous abnormality.  2. Mild generalized soft tissue swelling, greater laterally.  3. Osteopenia and degenerative change, calcaneal spurring.   I agree with the radiologist interpretation   Medicines ordered and prescription drug management:  I ordered medication including decadron , percocet  for sx  Reevaluation of the patient after these medicines showed that the patient improved I have reviewed the patients  home medicines and have made adjustments as needed  Critical Interventions:  Pain control   Problem List / ED Course:  R ankle gout:  sx c/w prior gout flare.  Pt is stable for d/c.  She is given a list of foods to avoid.  Pt is stable for d/c.  Return if worse.  F/u with pcp.   Reevaluation:  After the interventions noted above, I reevaluated the patient and found that they have :improved   Social Determinants of Health:  Lives at home   Dispostion:  After consideration of the diagnostic results and the patients response to treatment, I feel that the patent would benefit from discharge with outpatient f/u.       Final diagnoses:  Acute gout of right ankle, unspecified cause    ED Discharge Orders          Ordered    oxyCODONE -acetaminophen  (PERCOCET/ROXICET) 5-325 MG tablet  Every 6 hours PRN        05/14/24 0812    colchicine 0.6 MG tablet  Daily        05/14/24 0815    predniSONE  (DELTASONE ) 20 MG tablet  Q breakfast        05/14/24 0815               Dean Clarity, MD 05/14/24 [phone]

## 2024-06-07 ENCOUNTER — Ambulatory Visit: Admitting: Orthopedic Surgery
